# Patient Record
Sex: Female | Born: 1980
Health system: Southern US, Community
[De-identification: ages and names within clinical notes are randomized; demographics above are authoritative.]

## PROBLEM LIST (undated history)

## (undated) DIAGNOSIS — U071 COVID-19: Secondary | ICD-10-CM

## (undated) DIAGNOSIS — F32A Depression, unspecified: Secondary | ICD-10-CM

## (undated) DIAGNOSIS — J45909 Unspecified asthma, uncomplicated: Secondary | ICD-10-CM

## (undated) DIAGNOSIS — F419 Anxiety disorder, unspecified: Secondary | ICD-10-CM

## (undated) DIAGNOSIS — E282 Polycystic ovarian syndrome: Secondary | ICD-10-CM

## (undated) HISTORY — PX: KNEE SURGERY: SHX244

## (undated) HISTORY — PX: BACK SURGERY: SHX140

---

## 2017-09-18 ENCOUNTER — Encounter (HOSPITAL_COMMUNITY): Payer: Self-pay

## 2017-09-18 ENCOUNTER — Other Ambulatory Visit: Payer: Self-pay

## 2017-09-18 ENCOUNTER — Emergency Department (HOSPITAL_COMMUNITY)
Admission: EM | Admit: 2017-09-18 | Discharge: 2017-09-18 | Disposition: A | Discharge status: Home / Self Care | Payer: Medicaid Other | Attending: Emergency Medicine | Admitting: Emergency Medicine

## 2017-09-18 DIAGNOSIS — F17200 Nicotine dependence, unspecified, uncomplicated: Secondary | ICD-10-CM | POA: Insufficient documentation

## 2017-09-18 DIAGNOSIS — H9312 Tinnitus, left ear: Secondary | ICD-10-CM | POA: Insufficient documentation

## 2017-09-18 NOTE — ED Provider Notes (Signed)
[facility]-EMERGENCY DEPT Provider Note   CSN: [id number] Arrival date & time: 09/18/17  4098     History   Chief Complaint Chief Complaint  Patient presents with  . ringing of ear    HPI Valisha Heslin is a 37 y.o. female.  37 year old healthy female who presents with ringing in ear.  Yesterday morning around 5 AM, she began having ringing in her left ear that has been relatively constant since it began.  She denies any hearing loss.  No recent exposure to loud noise, headache, fevers, vertigo, focal weakness/numbness, balance problems, vomiting, URI symptoms, allergic rhinitis symptoms, or recent illness.  No new medications.  No alcohol or drug use.  The history is provided by the patient.    History reviewed. No pertinent past medical history.  There are no active problems to display for this patient.   Past Surgical History:  Procedure Laterality Date  . BACK SURGERY       OB History   None      Home Medications    Prior to Admission medications   Medication Sig Start Date End Date Taking? Authorizing Provider  HYDROcodone-acetaminophen (NORCO) 7.5-325 MG tablet Take 1 tablet by mouth every 6 (six) hours as needed for moderate pain.   Yes [provider]    Family History Family History  Problem Relation Age of Onset  . Diabetes Mother   . Heart failure Mother   . Hypertension Mother     Social History Social History   Tobacco Use  . Smoking status: Current Some Day Smoker  . Smokeless tobacco: Never Used  Substance Use Topics  . Alcohol use: Yes    Comment: occasinoally  . Drug use: Never     Allergies   Patient has no known allergies.   Review of Systems Review of Systems All other systems reviewed and are negative except that which was mentioned in HPI   Physical Exam Updated Vital Signs BP 109/80 (BP Location: Right Arm)   Pulse 87   Temp 98.4 F (36.9 C) (Oral)   Resp 20   Ht 5\' 8"  (1.727 m)    Wt 73 kg   LMP 09/10/2017   SpO2 100%   BMI 24.48 kg/m   Physical Exam  Constitutional: She is oriented to person, place, and time. She appears well-developed and well-nourished. No distress.  Awake, alert  HENT:  Head: Normocephalic and atraumatic.  Right Ear: Tympanic membrane and ear canal normal.  Left Ear: Tympanic membrane and ear canal normal.  Eyes: Pupils are equal, round, and reactive to light. Conjunctivae and EOM are normal.  Neck: Neck supple.  Cardiovascular: Normal rate, regular rhythm and normal heart sounds.  No murmur heard. Pulmonary/Chest: Effort normal and breath sounds normal. No respiratory distress.  Abdominal: Soft. Bowel sounds are normal. She exhibits no distension. There is no tenderness.  Musculoskeletal: She exhibits no edema.  Neurological: She is alert and oriented to person, place, and time. She has normal reflexes. She displays normal reflexes. No cranial nerve deficit. She exhibits normal muscle tone.  Fluent speech, normal finger-to-nose testing, no clonus 5/5 strength and normal sensation x all 4 extremities  Skin: Skin is warm and dry.  Psychiatric: She has a normal mood and affect. Judgment and thought content normal.  Nursing note and vitals reviewed.    ED Treatments / Results  Labs (all labs ordered are listed, but only abnormal results are displayed) Labs Reviewed - No data to display  EKG None  Radiology No results found.  Procedures Procedures (including critical care time)  Medications Ordered in ED Medications - No data to display   Initial Impression / Assessment and Plan / ED Course  I have reviewed the triage vital signs and the nursing notes.       Normal neuro exam, no headache, vertigo, or other neuro complaints to suggest vascular problem, no URI/allergy sx to suggest eustachian tube dysfunction.  No evidence of otitis media or other problem of TM.  Recommended follow-up with ENT for clinical evaluation and  possible audiology evaluation if her symptoms continue.  Return precautions reviewed and patient voiced understanding.  Final Clinical Impressions(s) / ED Diagnoses   Final diagnoses:  Tinnitus of left ear    ED Discharge Orders    None       Calie Buttrey, Ambrose Finlandachel Morgan, MD 09/18/17 [phone]

## 2017-09-18 NOTE — ED Notes (Signed)
Patient verbalized understanding of discharge instructions, no questions. Patient ambulated out of ED with steady gait in no distress.  

## 2017-09-18 NOTE — ED Triage Notes (Signed)
Patient c/o ringing of the left ear since yesterday.

## 2017-10-27 DIAGNOSIS — F17290 Nicotine dependence, other tobacco product, uncomplicated: Secondary | ICD-10-CM | POA: Insufficient documentation

## 2017-10-27 DIAGNOSIS — Z79899 Other long term (current) drug therapy: Secondary | ICD-10-CM | POA: Insufficient documentation

## 2017-10-27 DIAGNOSIS — R51 Headache: Secondary | ICD-10-CM | POA: Diagnosis present

## 2017-10-27 DIAGNOSIS — G43809 Other migraine, not intractable, without status migrainosus: Secondary | ICD-10-CM | POA: Diagnosis not present

## 2017-10-28 ENCOUNTER — Encounter (HOSPITAL_COMMUNITY): Payer: Self-pay | Admitting: Emergency Medicine

## 2017-10-28 ENCOUNTER — Emergency Department (HOSPITAL_COMMUNITY)
Admission: EM | Admit: 2017-10-28 | Discharge: 2017-10-28 | Disposition: A | Discharge status: Home / Self Care | Payer: Medicaid Other | Attending: Emergency Medicine | Admitting: Emergency Medicine

## 2017-10-28 ENCOUNTER — Emergency Department (HOSPITAL_COMMUNITY): Payer: Medicaid Other

## 2017-10-28 DIAGNOSIS — G43809 Other migraine, not intractable, without status migrainosus: Secondary | ICD-10-CM

## 2017-10-28 MED ORDER — DIPHENHYDRAMINE HCL 50 MG/ML IJ SOLN
25.0000 mg | Freq: Once | INTRAMUSCULAR | Status: AC
  Administered 2017-10-28: 25 mg via INTRAVENOUS
  Filled 2017-10-28: qty 1

## 2017-10-28 MED ORDER — DEXAMETHASONE SODIUM PHOSPHATE 10 MG/ML IJ SOLN
10.0000 mg | Freq: Once | INTRAMUSCULAR | Status: AC
  Administered 2017-10-28: 10 mg via INTRAVENOUS
  Filled 2017-10-28: qty 1

## 2017-10-28 MED ORDER — METOCLOPRAMIDE HCL 5 MG/ML IJ SOLN
10.0000 mg | Freq: Once | INTRAMUSCULAR | Status: AC
  Administered 2017-10-28: 10 mg via INTRAVENOUS
  Filled 2017-10-28: qty 2

## 2017-10-28 NOTE — ED Provider Notes (Signed)
[facility] EMERGENCY DEPARTMENT Provider Note   CSN: [id number] Arrival date & time: 10/27/17  2358     History   Chief Complaint Chief Complaint  Patient presents with  . Headache    HPI Angelisse Riso is a 37 y.o. female.  Patient is a 37 year old female who presents with a headache.  She describes a one-week history of pain to her forehead shooting to the top of her head.  She has no nausea or vomiting.  No fevers.  No recent head injury.  No dizziness.  No numbness or weakness to her extremities.  No fevers.  She has had similar headaches in the past although they typically resolve with ibuprofen.  She never had any that have lasted this long and she is never had any that she is had to come get treatment for.  She denies any associated neck pain.  She has tried ibuprofen with no improvement in symptoms.     History reviewed. No pertinent past medical history.  There are no active problems to display for this patient.   Past Surgical History:  Procedure Laterality Date  . BACK SURGERY       OB History   None      Home Medications    Prior to Admission medications   Medication Sig Start Date End Date Taking? Authorizing Provider  HYDROcodone-acetaminophen (NORCO) 7.5-325 MG tablet Take 1 tablet by mouth every 6 (six) hours as needed for moderate pain.   Yes [provider]    Family History Family History  Problem Relation Age of Onset  . Diabetes Mother   . Heart failure Mother   . Hypertension Mother     Social History Social History   Tobacco Use  . Smoking status: Current Some Day Smoker    Types: Cigars  . Smokeless tobacco: Never Used  Substance Use Topics  . Alcohol use: Yes    Comment: occasinoally  . Drug use: Never     Allergies   Patient has no known allergies.   Review of Systems Review of Systems  Constitutional: Negative for chills, diaphoresis, fatigue and fever.  HENT: Negative for  congestion, rhinorrhea and sneezing.   Eyes: Negative.   Respiratory: Negative for cough, chest tightness and shortness of breath.   Cardiovascular: Negative for chest pain and leg swelling.  Gastrointestinal: Negative for abdominal pain, blood in stool, diarrhea, nausea and vomiting.  Genitourinary: Negative for difficulty urinating, flank pain, frequency and hematuria.  Musculoskeletal: Negative for arthralgias and back pain.  Skin: Negative for rash.  Neurological: Positive for headaches. Negative for dizziness, speech difficulty, weakness and numbness.     Physical Exam Updated Vital Signs BP 112/71   Pulse 76   Temp 98.7 F (37.1 C) (Oral)   Resp 17   Ht 5\' 8"  (1.727 m)   Wt 73 kg   LMP 10/03/2017 (Approximate)   SpO2 100%   BMI 24.48 kg/m   Physical Exam  Constitutional: She is oriented to person, place, and time. She appears well-developed and well-nourished.  HENT:  Head: Normocephalic and atraumatic.  Eyes: Pupils are equal, round, and reactive to light.  Neck: Normal range of motion. Neck supple.  Cardiovascular: Normal rate, regular rhythm and normal heart sounds.  Pulmonary/Chest: Effort normal and breath sounds normal. No respiratory distress. She has no wheezes. She has no rales. She exhibits no tenderness.  Abdominal: Soft. Bowel sounds are normal. There is no tenderness. There is no rebound and no guarding.  Musculoskeletal: Normal range of motion. She exhibits no edema.  Lymphadenopathy:    She has no cervical adenopathy.  Neurological: She is alert and oriented to person, place, and time.  Motor 5/5 all extremities Sensation grossly intact to LT all extremities Finger to Nose intact, no pronator drift CN II-XII grossly intact    Skin: Skin is warm and dry. No rash noted.  Psychiatric: She has a normal mood and affect.     ED Treatments / Results  Labs (all labs ordered are listed, but only abnormal results are displayed) Labs Reviewed - No data  to display  EKG None  Radiology Ct Head Wo Contrast  Result Date: 10/28/2017 CLINICAL DATA:  37 year old female with headache. EXAM: CT HEAD WITHOUT CONTRAST TECHNIQUE: Contiguous axial images were obtained from the base of the skull through the vertex without intravenous contrast. COMPARISON:  None. FINDINGS: Brain: No evidence of acute infarction, hemorrhage, hydrocephalus, extra-axial collection or mass lesion/mass effect. Vascular: No hyperdense vessel or unexpected calcification. Skull: Normal. Negative for fracture or focal lesion. Sinuses/Orbits: No acute finding. Other: None IMPRESSION: Normal noncontrast CT of the brain. Electronically Signed   By: Elgie Collard M.D.   On: 10/28/2017 06:44    Procedures Procedures (including critical care time)  Medications Ordered in ED Medications  diphenhydrAMINE (BENADRYL) injection 25 mg (25 mg Intravenous Given 10/28/17 0554)  metoCLOPramide (REGLAN) injection 10 mg (10 mg Intravenous Given 10/28/17 0554)  dexamethasone (DECADRON) injection 10 mg (10 mg Intravenous Given 10/28/17 0554)     Initial Impression / Assessment and Plan / ED Course  I have reviewed the triage vital signs and the nursing notes.  Pertinent labs & imaging results that were available during my care of the patient were reviewed by me and considered in my medical decision making (see chart for details).     Patient is a 37 year old female who presents with headache.  She is had migraines before but none that have lasted this long.  She never been evaluated for migraines in the past.  She has no fever.  No other symptoms that would be more concerning for subarachnoid hemorrhage or meningitis.  She has no neurologic deficits.  She is feeling completely better after migraine cocktail in the ED.  She was discharged home in good condition.  She was given a referral to neurology if her symptoms continue.  Return precautions were given.  Final Clinical Impressions(s) /  ED Diagnoses   Final diagnoses:  Other migraine without status migrainosus, not intractable    ED Discharge Orders    None       Rolan Bucco, MD 10/28/17 [phone]

## 2017-10-28 NOTE — ED Notes (Signed)
Pt verbalizes understanding of d/c instructions. Pt ambulatory at d/c with all belongings and with family.   

## 2017-10-28 NOTE — ED Triage Notes (Signed)
Pt reports frontal headache all week with hx of same. States pain radiates to the back of her head. Sensitive to light and sound. Ibuprofen not effective.

## 2018-01-25 ENCOUNTER — Emergency Department (HOSPITAL_BASED_OUTPATIENT_CLINIC_OR_DEPARTMENT_OTHER): Payer: Managed Care, Other (non HMO)

## 2018-01-25 ENCOUNTER — Emergency Department (HOSPITAL_BASED_OUTPATIENT_CLINIC_OR_DEPARTMENT_OTHER)
Admission: EM | Admit: 2018-01-25 | Discharge: 2018-01-25 | Disposition: A | Discharge status: Home / Self Care | Payer: Managed Care, Other (non HMO) | Attending: Emergency Medicine | Admitting: Emergency Medicine

## 2018-01-25 ENCOUNTER — Encounter (HOSPITAL_BASED_OUTPATIENT_CLINIC_OR_DEPARTMENT_OTHER): Payer: Self-pay | Admitting: *Deleted

## 2018-01-25 ENCOUNTER — Other Ambulatory Visit: Payer: Self-pay

## 2018-01-25 DIAGNOSIS — Z3202 Encounter for pregnancy test, result negative: Secondary | ICD-10-CM | POA: Diagnosis not present

## 2018-01-25 DIAGNOSIS — F17290 Nicotine dependence, other tobacco product, uncomplicated: Secondary | ICD-10-CM | POA: Diagnosis not present

## 2018-01-25 DIAGNOSIS — R0789 Other chest pain: Secondary | ICD-10-CM | POA: Diagnosis not present

## 2018-01-25 DIAGNOSIS — R0602 Shortness of breath: Secondary | ICD-10-CM | POA: Diagnosis not present

## 2018-01-25 LAB — CBC
HCT: 37.7 % (ref 36.0–46.0)
Hemoglobin: 11.7 g/dL — ABNORMAL LOW (ref 12.0–15.0)
MCH: 28.8 pg (ref 26.0–34.0)
MCHC: 31 g/dL (ref 30.0–36.0)
MCV: 92.9 fL (ref 80.0–100.0)
Platelets: 399 10*3/uL (ref 150–400)
RBC: 4.06 MIL/uL (ref 3.87–5.11)
RDW: 14.4 % (ref 11.5–15.5)
WBC: 7.7 10*3/uL (ref 4.0–10.5)
nRBC: 0 % (ref 0.0–0.2)

## 2018-01-25 LAB — TROPONIN I

## 2018-01-25 LAB — BASIC METABOLIC PANEL
ANION GAP: 7 (ref 5–15)
BUN: 12 mg/dL (ref 6–20)
CALCIUM: 8.6 mg/dL — AB (ref 8.9–10.3)
CO2: 26 mmol/L (ref 22–32)
Chloride: 103 mmol/L (ref 98–111)
Creatinine, Ser: 0.83 mg/dL (ref 0.44–1.00)
Glucose, Bld: 86 mg/dL (ref 70–99)
Potassium: 3.5 mmol/L (ref 3.5–5.1)
SODIUM: 136 mmol/L (ref 135–145)

## 2018-01-25 LAB — D-DIMER, QUANTITATIVE (NOT AT ARMC): D DIMER QUANT: 0.3 ug{FEU}/mL (ref 0.00–0.50)

## 2018-01-25 LAB — PREGNANCY, URINE: Preg Test, Ur: NEGATIVE

## 2018-01-25 MED ORDER — METHOCARBAMOL 500 MG PO TABS
500.0000 mg | ORAL_TABLET | Freq: Once | ORAL | Status: AC
  Administered 2018-01-25: 500 mg via ORAL
  Filled 2018-01-25: qty 1

## 2018-01-25 MED ORDER — METHOCARBAMOL 500 MG PO TABS: 500.0000 mg | ORAL_TABLET | Freq: Two times a day (BID) | ORAL | 0 refills | Status: DC

## 2018-01-25 MED ORDER — MORPHINE SULFATE (PF) 4 MG/ML IV SOLN
4.0000 mg | Freq: Once | INTRAVENOUS | Status: AC
  Administered 2018-01-25: 4 mg via INTRAVENOUS
  Filled 2018-01-25: qty 1

## 2018-01-25 MED FILL — METHOCARBAMOL 500 MG TABLET: 500 | 10 days supply | Qty: 20 | Fill #0

## 2018-01-25 NOTE — ED Notes (Signed)
Patient transported to X-ray 

## 2018-01-25 NOTE — ED Notes (Signed)
ED Provider at bedside. 

## 2018-01-25 NOTE — ED Provider Notes (Signed)
[facility] EMERGENCY DEPARTMENT Provider Note   CSN: [id number] Arrival date & time: 01/25/18  1244     History   Chief Complaint Chief Complaint  Patient presents with  . Chest Pain    HPI Jamie Vang is a 38 y.o. female.  Jamie Vang is a 38 y.o. female who is otherwise healthy, presents to the emergency department for evaluation of sharp left-sided chest pain.  She reports pain initially started last Friday as a sharp pain underneath her left breast, this pain seems to come and go and is is worse with deep breath and is associated with some mild shortness of breath.  She initially went to urgent care last Friday they did a chest x-ray and EKG which were reassuring and sent her home with Voltaren which initially gave some relief but this morning when she woke up and turned over in bed pain seemed to return.  Pain does not radiate to the arm neck or jaw.  It is not made worse with exertion.  It is worse with deep breath as well as palpation and movement.  She denies any inciting injury or trauma, no recent strenuous activity or heavy lifting.  No cough or fever.  No lower extremity swelling or pain.  No history of PE or DVT, no recent long distance travel or surgery, no associated hemoptysis, patient is not on any estrogen containing birth control.  She denies any associated abdominal pain, no nausea, vomiting or diaphoresis.  She took Voltaren today without relief has not tried anything else to treat this pain, denies any other aggravating or alleviating factors.  No history of previous cardiac events.  No smoking history, no history of hypertension, diabetes, hyperlipidemia or obesity.  Denies alcohol or drug use.     History reviewed. No pertinent past medical history.  There are no active problems to display for this patient.   Past Surgical History:  Procedure Laterality Date  . BACK SURGERY    . CESAREAN SECTION    . KNEE SURGERY       OB History     No obstetric history on file.      Home Medications    Prior to Admission medications   Medication Sig Start Date End Date Taking? Authorizing Provider  HYDROcodone-acetaminophen (NORCO) 7.5-325 MG tablet Take 1 tablet by mouth every 6 (six) hours as needed for moderate pain.    [provider]  methocarbamol (ROBAXIN) 500 MG tablet Take 1 tablet (500 mg total) by mouth 2 (two) times daily. 01/25/18   Dartha LodgeFord, Starlena Beil N, PA-C    Family History Family History  Problem Relation Age of Onset  . Diabetes Mother   . Heart failure Mother   . Hypertension Mother     Social History Social History   Tobacco Use  . Smoking status: Current Some Day Smoker    Types: Cigars  . Smokeless tobacco: Never Used  Substance Use Topics  . Alcohol use: Yes    Comment: occasinoally  . Drug use: Never     Allergies   Patient has no known allergies.   Review of Systems Review of Systems  Constitutional: Negative for chills and fever.  HENT: Negative.   Eyes: Negative for visual disturbance.  Respiratory: Positive for shortness of breath. Negative for cough, chest tightness and wheezing.   Cardiovascular: Positive for chest pain. Negative for palpitations and leg swelling.  Gastrointestinal: Negative for abdominal pain, nausea and vomiting.  Genitourinary: Negative for dysuria, flank pain  and frequency.  Musculoskeletal: Negative for arthralgias and myalgias.  Skin: Negative for color change and rash.  Neurological: Negative for dizziness, syncope, light-headedness and headaches.     Physical Exam Updated Vital Signs BP 106/70 (BP Location: Left Arm)   Pulse 64   Temp 98.5 F (36.9 C) (Oral)   Resp 14   Ht 5\' 8"  (1.727 m)   Wt 77.1 kg   LMP 01/19/2018 (Exact Date)   SpO2 100%   BMI 25.85 kg/m   Physical Exam Vitals signs and nursing note reviewed.  Constitutional:      General: She is not in acute distress.    Appearance: She is well-developed and normal weight.  She is not ill-appearing or diaphoretic.  HENT:     Head: Normocephalic and atraumatic.  Eyes:     General:        Right eye: No discharge.        Left eye: No discharge.  Neck:     Musculoskeletal: Neck supple.     Trachea: No tracheal deviation.  Cardiovascular:     Rate and Rhythm: Normal rate and regular rhythm.     Pulses:          Radial pulses are 2+ on the right side and 2+ on the left side.       Dorsalis pedis pulses are 2+ on the right side and 2+ on the left side.       Posterior tibial pulses are 2+ on the right side and 2+ on the left side.     Heart sounds: Normal heart sounds. No murmur. No friction rub. No gallop.   Pulmonary:     Effort: Pulmonary effort is normal. No respiratory distress.     Breath sounds: Normal breath sounds.     Comments: Respirations equal and unlabored, patient able to speak in full sentences, lungs clear to auscultation bilaterally Chest:     Chest wall: Tenderness present.    Abdominal:     General: Bowel sounds are normal.     Palpations: Abdomen is soft. There is no mass.     Tenderness: There is no abdominal tenderness. There is no guarding or rebound.     Comments: Respirations equal and unlabored, patient able to speak in full sentences, lungs clear to auscultation bilaterally  Musculoskeletal:     Right lower leg: She exhibits no tenderness. No edema.     Left lower leg: She exhibits no tenderness. No edema.  Skin:    General: Skin is warm and dry.     Capillary Refill: Capillary refill takes less than 2 seconds.  Neurological:     Mental Status: She is alert and oriented to person, place, and time.     Coordination: Coordination normal.  Psychiatric:        Mood and Affect: Mood normal.        Behavior: Behavior normal.      ED Treatments / Results  Labs (all labs ordered are listed, but only abnormal results are displayed) Labs Reviewed  BASIC METABOLIC PANEL - Abnormal; Notable for the following components:       Result Value   Calcium 8.6 (*)    All other components within normal limits  CBC - Abnormal; Notable for the following components:   Hemoglobin 11.7 (*)    All other components within normal limits  D-DIMER, QUANTITATIVE (NOT AT [facility])  TROPONIN I  PREGNANCY, URINE    EKG EKG Interpretation  Date/Time:  Thursday  January 25 2018 12:52:35 EST Ventricular Rate:  73 PR Interval:    QRS Duration: 85 QT Interval:  394 QTC Calculation: 435 R Axis:   86 Text Interpretation:  Sinus rhythm Normal ECG No previous tracing Confirmed by Gwyneth Sprout ([id number]) on 01/25/2018 1:34:42 PM   Radiology Dg Chest 2 View  Result Date: 01/25/2018 CLINICAL DATA:  Chest pain EXAM: CHEST - 2 VIEW COMPARISON:  None. FINDINGS: Lungs are clear. Heart size and pulmonary vascularity are normal. No adenopathy. No pneumothorax. No bone lesions. IMPRESSION: No edema or consolidation. Electronically Signed   By: Bretta Bang III M.D.   On: 01/25/2018 13:21    Procedures Procedures (including critical care time)  Medications Ordered in ED Medications  morphine 4 MG/ML injection 4 mg (4 mg Intravenous Given 01/25/18 1426)  methocarbamol (ROBAXIN) tablet 500 mg (500 mg Oral Given 01/25/18 1426)     Initial Impression / Assessment and Plan / ED Course  I have reviewed the triage vital signs and the nursing notes.  Pertinent labs & imaging results that were available during my care of the patient were reviewed by me and considered in my medical decision making (see chart for details).  Patient is to be discharged with recommendation to follow up with PCP in regards to today's hospital visit.  Feel that chest pain is most likely musculoskeletal in nature and has been present intermittently over the past week and is worse with certain movements and can be reproduced with palpation.  Chest pain is not likely of cardiac or pulmonary etiology d/t presentation, given pleuritic nature of pain there was some concern  for PE although patient is otherwise low risk, d-dimer was negative, VSS, no tracheal deviation, no JVD or new murmur, RRR, breath sounds equal bilaterally, EKG without acute abnormalities, negative troponin, and negative CXR.  Chest pain resolved with treatment with muscle relaxer and morphine here in the emergency department.  Discharge home with Robaxin and have her continue with NSAID therapy as well.  Pt has been advised to return to the ED if CP becomes exertional, associated with diaphoresis or nausea, radiates to left jaw/arm, worsens or becomes concerning in any way. Pt appears reliable for follow up and is agreeable to discharge.    Final Clinical Impressions(s) / ED Diagnoses   Final diagnoses:  Atypical chest pain  Chest wall pain    ED Discharge Orders         Ordered    methocarbamol (ROBAXIN) 500 MG tablet  2 times daily     01/25/18 1550           Dartha Lodge, [place] 01/29/18 1353    Gwyneth Sprout, MD 01/31/18 1721

## 2018-01-25 NOTE — ED Triage Notes (Signed)
Last Friday she had cp under left breast that felt like a stabbing pain, associated with SOB, worsening with deep breath. Newton PiggVolteran was given and gave some relief for 2 days.

## 2018-12-25 ENCOUNTER — Ambulatory Visit: Payer: Managed Care, Other (non HMO) | Attending: Internal Medicine

## 2018-12-25 DIAGNOSIS — Z20822 Contact with and (suspected) exposure to covid-19: Secondary | ICD-10-CM

## 2018-12-27 LAB — NOVEL CORONAVIRUS, NAA: SARS-CoV-2, NAA: NOT DETECTED

## 2019-09-03 ENCOUNTER — Emergency Department (HOSPITAL_BASED_OUTPATIENT_CLINIC_OR_DEPARTMENT_OTHER)
Admission: EM | Admit: 2019-09-03 | Discharge: 2019-09-03 | Disposition: A | Discharge status: Home / Self Care | Payer: Managed Care, Other (non HMO) | Attending: Emergency Medicine | Admitting: Emergency Medicine

## 2019-09-03 ENCOUNTER — Encounter (HOSPITAL_BASED_OUTPATIENT_CLINIC_OR_DEPARTMENT_OTHER): Payer: Self-pay | Admitting: Emergency Medicine

## 2019-09-03 ENCOUNTER — Emergency Department (HOSPITAL_BASED_OUTPATIENT_CLINIC_OR_DEPARTMENT_OTHER): Payer: Managed Care, Other (non HMO)

## 2019-09-03 ENCOUNTER — Emergency Department (HOSPITAL_BASED_OUTPATIENT_CLINIC_OR_DEPARTMENT_OTHER)
Admit: 2019-09-03 | Discharge: 2019-09-03 | Disposition: A | Discharge status: Home / Self Care | Payer: Managed Care, Other (non HMO) | Attending: Emergency Medicine | Admitting: Emergency Medicine

## 2019-09-03 ENCOUNTER — Other Ambulatory Visit: Payer: Self-pay

## 2019-09-03 DIAGNOSIS — M25562 Pain in left knee: Secondary | ICD-10-CM | POA: Insufficient documentation

## 2019-09-03 DIAGNOSIS — F17290 Nicotine dependence, other tobacco product, uncomplicated: Secondary | ICD-10-CM | POA: Diagnosis not present

## 2019-09-03 MED ORDER — MECLIZINE HCL 25 MG PO TABS: 12.5000 mg | ORAL_TABLET | Freq: Once | ORAL | Status: DC

## 2019-09-03 MED ORDER — DEXAMETHASONE SODIUM PHOSPHATE 4 MG/ML IJ SOLN: 4.0000 mg | Freq: Once | INTRAMUSCULAR | Status: DC

## 2019-09-03 MED ORDER — DIPHENHYDRAMINE HCL 50 MG/ML IJ SOLN: 12.5000 mg | Freq: Once | INTRAMUSCULAR | Status: DC

## 2019-09-03 MED ORDER — NAPROXEN 250 MG PO TABS
500.0000 mg | ORAL_TABLET | Freq: Once | ORAL | Status: AC
  Administered 2019-09-03: 500 mg via ORAL
  Filled 2019-09-03: qty 2

## 2019-09-03 MED ORDER — IBUPROFEN 800 MG PO TABS: 800.0000 mg | ORAL_TABLET | Freq: Three times a day (TID) | ORAL | 0 refills | Status: DC

## 2019-09-03 MED ORDER — METOCLOPRAMIDE HCL 5 MG/ML IJ SOLN: 10.0000 mg | Freq: Once | INTRAMUSCULAR | Status: DC

## 2019-09-03 MED ORDER — ACETAMINOPHEN 500 MG PO TABS
1000.0000 mg | ORAL_TABLET | Freq: Once | ORAL | Status: AC
  Administered 2019-09-03: 1000 mg via ORAL
  Filled 2019-09-03: qty 2

## 2019-09-03 NOTE — ED Triage Notes (Signed)
Pt c/o pain to left knee x 2 days, no known injury

## 2019-09-03 NOTE — ED Provider Notes (Signed)
Patient informed of negative DVT study, given copy of study and work note.  IMPRESSION: No evidence of left lower extremity deep venous thrombosis.   Note: Portions of this report may have been transcribed using voice recognition software. Every effort was made to ensure accuracy; however, inadvertent computerized transcription errors may still be present.   Bill Salinas, PA-C 09/03/19 1157    Melene Plan, DO 09/03/19 1159

## 2019-09-03 NOTE — ED Provider Notes (Signed)
[facility] EMERGENCY DEPARTMENT Provider Note   CSN: [id number] Arrival date & time: 09/03/19  7169     History Chief Complaint  Patient presents with  . Knee Pain    Stephaniemarie Stoffel is a 39 y.o. female.  The history is provided by the patient.  Knee Pain Location:  Knee Time since incident:  2 days Injury: no   Knee location:  L knee (posterior to the knee with shooting pain down the leg ) Pain details:    Quality:  Shooting   Severity:  Moderate   Onset quality:  Gradual   Duration:  2 days   Timing:  Constant   Progression:  Unchanged Chronicity:  New Dislocation: no   Foreign body present:  No foreign bodies Tetanus status:  Unknown Relieved by:  Nothing Worsened by:  Nothing Ineffective treatments:  None tried Associated symptoms: no back pain, no decreased ROM, no fatigue, no fever, no itching, no muscle weakness, no neck pain, no numbness, no stiffness, no swelling and no tingling   Risk factors: no concern for non-accidental trauma        History reviewed. No pertinent past medical history.  There are no problems to display for this patient.   Past Surgical History:  Procedure Laterality Date  . BACK SURGERY    . CESAREAN SECTION    . KNEE SURGERY       OB History   No obstetric history on file.     Family History  Problem Relation Age of Onset  . Diabetes Mother   . Heart failure Mother   . Hypertension Mother     Social History   Tobacco Use  . Smoking status: Current Some Day Smoker    Types: Cigars  . Smokeless tobacco: Never Used  Vaping Use  . Vaping Use: Never used  Substance Use Topics  . Alcohol use: Yes    Comment: occasinoally  . Drug use: Never    Home Medications Prior to Admission medications   Medication Sig Start Date End Date Taking? Authorizing Provider  HYDROcodone-acetaminophen (NORCO) 7.5-325 MG tablet Take 1 tablet by mouth every 6 (six) hours as needed for moderate pain.    [provider]  ibuprofen (ADVIL) 800 MG tablet Take 1 tablet (800 mg total) by mouth 3 (three) times daily. 09/03/19   Braedon Sjogren, MD  methocarbamol (ROBAXIN) 500 MG tablet Take 1 tablet (500 mg total) by mouth 2 (two) times daily. 01/25/18   Dartha Lodge, PA-C    Allergies    Patient has no known allergies.  Review of Systems   Review of Systems  Constitutional: Negative for fatigue and fever.  HENT: Negative for congestion.   Eyes: Negative for visual disturbance.  Respiratory: Negative for shortness of breath.   Cardiovascular: Negative for chest pain and leg swelling.  Gastrointestinal: Negative for abdominal pain.  Genitourinary: Negative for difficulty urinating.  Musculoskeletal: Positive for arthralgias. Negative for back pain, neck pain and stiffness.  Skin: Negative for itching.  Neurological: Negative for dizziness.  Psychiatric/Behavioral: Negative for agitation.  All other systems reviewed and are negative.   Physical Exam Updated Vital Signs BP 105/73   Pulse 89   Temp 98.9 F (37.2 C) (Oral)   Resp 16   Ht 5\' 8"  (1.727 m)   Wt 94.8 kg   SpO2 98%   BMI 31.78 kg/m   Physical Exam Vitals and nursing note reviewed.  Constitutional:      General: She  is not in acute distress.    Appearance: Normal appearance.  HENT:     Head: Normocephalic and atraumatic.     Nose: Nose normal.  Eyes:     Conjunctiva/sclera: Conjunctivae normal.     Pupils: Pupils are equal, round, and reactive to light.  Cardiovascular:     Rate and Rhythm: Normal rate and regular rhythm.     Pulses: Normal pulses.     Heart sounds: Normal heart sounds.  Pulmonary:     Effort: Pulmonary effort is normal.     Breath sounds: Normal breath sounds.  Abdominal:     General: Abdomen is flat. Bowel sounds are normal.     Tenderness: There is no abdominal tenderness. There is no guarding or rebound.  Musculoskeletal:        General: No swelling or tenderness. Normal range of motion.       Cervical back: Normal range of motion and neck supple.     Left knee: Normal.     Right lower leg: Normal.     Left lower leg: Normal.     Right ankle: Normal.     Right Achilles Tendon: Normal.     Left ankle: Normal.     Left Achilles Tendon: Normal.     Right foot: Normal.     Left foot: Normal.     Comments: Negative Homan's sign   Skin:    General: Skin is warm and dry.     Capillary Refill: Capillary refill takes less than 2 seconds.  Neurological:     General: No focal deficit present.     Mental Status: She is alert and oriented to person, place, and time. Mental status is at baseline.     Deep Tendon Reflexes: Reflexes normal.  Psychiatric:        Mood and Affect: Mood normal.        Behavior: Behavior normal.     ED Results / Procedures / Treatments   Labs (all labs ordered are listed, but only abnormal results are displayed) Labs Reviewed - No data to display  EKG None  Radiology DG Knee Complete 4 Views Left  Result Date: 09/03/2019 CLINICAL DATA:  Left knee pain for 2 days, atraumatic EXAM: LEFT KNEE - COMPLETE 4+ VIEW COMPARISON:  None. FINDINGS: No evidence of fracture, dislocation, or joint effusion. No evidence of arthropathy or other focal bone abnormality. Soft tissues are unremarkable. IMPRESSION: Negative. Electronically Signed   By: Marnee Spring M.D.   On: 09/03/2019 04:51    Procedures Procedures (including critical care time)  Medications Ordered in ED Medications  naproxen (NAPROSYN) tablet 500 mg (has no administration in time range)  acetaminophen (TYLENOL) tablet 1,000 mg (has no administration in time range)    ED Course  I have reviewed the triage vital signs and the nursing notes.  Pertinent labs & imaging results that were available during my care of the patient were reviewed by me and considered in my medical decision making (see chart for details).    I suspect this is a Baker's cyst vs neuropathic pain but will send for  DVT study as an outpatient to ensure no clot causing this pain.  NSAIDs and ice.    Koren Plyler was evaluated in Emergency Department on 09/03/2019 for the symptoms described in the history of present illness. She was evaluated in the context of the global COVID-19 pandemic, which necessitated consideration that the patient might be at risk for infection with the SARS-CoV-2  virus that causes COVID-19. Institutional protocols and algorithms that pertain to the evaluation of patients at risk for COVID-19 are in a state of rapid change based on information released by regulatory bodies including the CDC and federal and state organizations. These policies and algorithms were followed during the patient's care in the ED.  Final Clinical Impression(s) / ED Diagnoses Final diagnoses:  Acute pain of left knee   Return for intractable cough, coughing up blood,fevers >100.4 unrelieved by medication, shortness of breath, intractable vomiting, chest pain, shortness of breath, weakness,numbness, changes in speech, facial asymmetry,abdominal pain, passing out,Inability to tolerate liquids or food, cough, altered mental status or any concerns. No signs of systemic illness or infection. The patient is nontoxic-appearing on exam and vital signs are within normal limits.   I have reviewed the triage vital signs and the nursing notes. Pertinent labs &imaging results that were available during my care of the patient were reviewed by me and considered in my medical decision making (see chart for details).After history, exam, and medical workup I feel the patient has beenappropriately medically screened and is safe for discharge home. Pertinent diagnoses were discussed with the patient. Patient was given return precautions. Rx / DC Orders ED Discharge Orders         Ordered    US Venous Img Lower Unilateral Left        09/03/19 0620    ibuprofen (ADVIL) 800 MG tablet  3 times daily        09/03/19 0633            Zorah Backes, MD 09/03/19 [phone]

## 2020-01-11 ENCOUNTER — Emergency Department (HOSPITAL_BASED_OUTPATIENT_CLINIC_OR_DEPARTMENT_OTHER)
Admission: EM | Admit: 2020-01-11 | Discharge: 2020-01-11 | Disposition: A | Discharge status: Home / Self Care | Payer: Managed Care, Other (non HMO) | Attending: Emergency Medicine | Admitting: Emergency Medicine

## 2020-01-11 ENCOUNTER — Encounter (HOSPITAL_BASED_OUTPATIENT_CLINIC_OR_DEPARTMENT_OTHER): Payer: Self-pay | Admitting: Emergency Medicine

## 2020-01-11 ENCOUNTER — Other Ambulatory Visit: Payer: Self-pay

## 2020-01-11 DIAGNOSIS — E860 Dehydration: Secondary | ICD-10-CM | POA: Insufficient documentation

## 2020-01-11 DIAGNOSIS — R112 Nausea with vomiting, unspecified: Secondary | ICD-10-CM | POA: Diagnosis present

## 2020-01-11 DIAGNOSIS — U071 COVID-19: Secondary | ICD-10-CM | POA: Diagnosis not present

## 2020-01-11 DIAGNOSIS — F17290 Nicotine dependence, other tobacco product, uncomplicated: Secondary | ICD-10-CM | POA: Diagnosis not present

## 2020-01-11 LAB — CBC
HCT: 42.6 % (ref 36.0–46.0)
Hemoglobin: 14.1 g/dL (ref 12.0–15.0)
MCH: 28.8 pg (ref 26.0–34.0)
MCHC: 33.1 g/dL (ref 30.0–36.0)
MCV: 86.9 fL (ref 80.0–100.0)
Platelets: 364 10*3/uL (ref 150–400)
RBC: 4.9 MIL/uL (ref 3.87–5.11)
RDW: 13.2 % (ref 11.5–15.5)
WBC: 6 10*3/uL (ref 4.0–10.5)
nRBC: 0 % (ref 0.0–0.2)

## 2020-01-11 LAB — COMPREHENSIVE METABOLIC PANEL
ALT: 31 U/L (ref 0–44)
AST: 34 U/L (ref 15–41)
Albumin: 4.4 g/dL (ref 3.5–5.0)
Alkaline Phosphatase: 43 U/L (ref 38–126)
Anion gap: 12 (ref 5–15)
BUN: 8 mg/dL (ref 6–20)
CO2: 25 mmol/L (ref 22–32)
Calcium: 9 mg/dL (ref 8.9–10.3)
Chloride: 96 mmol/L — ABNORMAL LOW (ref 98–111)
Creatinine, Ser: 0.9 mg/dL (ref 0.44–1.00)
GFR, Estimated: >60 mL/min (ref >60)
Glucose, Bld: 97 mg/dL (ref 70–99)
Potassium: 3.4 mmol/L — ABNORMAL LOW (ref 3.5–5.1)
Sodium: 133 mmol/L — ABNORMAL LOW (ref 135–145)
Total Bilirubin: 0.4 mg/dL (ref 0.3–1.2)
Total Protein: 8.3 g/dL — ABNORMAL HIGH (ref 6.5–8.1)

## 2020-01-11 MED ORDER — ONDANSETRON HCL 4 MG/2ML IJ SOLN
4.0000 mg | Freq: Once | INTRAMUSCULAR | Status: AC
  Administered 2020-01-11: 4 mg via INTRAVENOUS
  Filled 2020-01-11: qty 2

## 2020-01-11 MED ORDER — ONDANSETRON 4 MG PO TBDP: 4.0000 mg | ORAL_TABLET | Freq: Three times a day (TID) | ORAL | 0 refills | Status: DC | PRN

## 2020-01-11 MED ORDER — SODIUM CHLORIDE 0.9 % IV BOLUS
1000.0000 mL | Freq: Once | INTRAVENOUS | Status: AC
  Administered 2020-01-11: 1000 mL via INTRAVENOUS

## 2020-01-11 NOTE — ED Triage Notes (Signed)
Pt was diagnosed with covid on Thursday  Pt states she has not been able to eat or drink for the past three days because everything comes back up   Pt denies diarrhea

## 2020-01-11 NOTE — Discharge Instructions (Addendum)
You were evaluated in the Emergency Department and after careful evaluation, we did not find any emergent condition requiring admission or further testing in the hospital.  Your exam/testing today was overall reassuring.  Symptoms seem to be due to COVID-19 infection.  Please use the Zofran nausea medicine to help you drink fluids at home and stay hydrated.  Please return to the Emergency Department if you experience any worsening of your condition.  Thank you for allowing [place] to be a part of your care.

## 2020-01-11 NOTE — ED Provider Notes (Signed)
[facility]-EMERGENCY DEPT [facility] Emergency Department Provider Note MRN:  [id number]  Arrival date & time: 01/11/20     Chief Complaint   Emesis   History of Present Illness   Jamie Vang is a 40 y.o. year-old female with no pertinent past medical history presenting to the ED with chief complaint of emesis.  Patient has had mild Covid symptoms for 6 days.  Malaise, fatigue, nausea.  Nausea and vomiting is worsened over the past 3 days and she has not been able to eat or drink anything during this time.  She feels dehydrated.  She denies fever or cough, no headache or vision change, no chest pain or shortness of breath, no abdominal pain, no dysuria.  Review of Systems  A complete 10 system review of systems was obtained and all systems are negative except as noted in the HPI and PMH.   Patient's Health History   History reviewed. No pertinent past medical history.  Past Surgical History:  Procedure Laterality Date  . CESAREAN SECTION    . KNEE SURGERY      Family History  Problem Relation Age of Onset  . Diabetes Mother   . Heart failure Mother   . Hypertension Mother     Social History   Socioeconomic History  . Marital status: Single    Spouse name: Not on file  . Number of children: Not on file  . Years of education: Not on file  . Highest education level: Not on file  Occupational History  . Not on file  Tobacco Use  . Smoking status: Current Some Day Smoker    Types: Cigars  . Smokeless tobacco: Never Used  Vaping Use  . Vaping Use: Never used  Substance and Sexual Activity  . Alcohol use: Yes    Comment: occasinoally  . Drug use: Never  . Sexual activity: Not on file  Other Topics Concern  . Not on file  Social History Narrative  . Not on file   Social Determinants of Health   Financial Resource Strain: Not on file  Food Insecurity: Not on file  Transportation Needs: Not on file  Physical Activity: Not on file  Stress: Not on file   Social Connections: Not on file  Intimate Partner Violence: Not on file     Physical Exam   Vitals:   01/11/20 0338  BP: 135/83  Pulse: 99  Resp: 20  Temp: 98.1 F (36.7 C)  SpO2: 100%    CONSTITUTIONAL: Well-appearing, NAD NEURO:  Alert and oriented x 3, no focal deficits EYES:  eyes equal and reactive ENT/NECK:  no LAD, no JVD CARDIO: Regular rate, well-perfused, normal S1 and S2 PULM:  CTAB no wheezing or rhonchi GI/GU:  normal bowel sounds, non-distended, non-tender MSK/SPINE:  No gross deformities, no edema SKIN:  no rash, atraumatic PSYCH:  Appropriate speech and behavior  *Additional and/or pertinent findings included in MDM below  Diagnostic and Interventional Summary    EKG Interpretation  Date/Time:    Ventricular Rate:    PR Interval:    QRS Duration:   QT Interval:    QTC Calculation:   R Axis:     Text Interpretation:        Labs Reviewed  COMPREHENSIVE METABOLIC PANEL - Abnormal; Notable for the following components:      Result Value   Sodium 133 (*)    Potassium 3.4 (*)    Chloride 96 (*)    Total Protein 8.3 (*)  All other components within normal limits  CBC    No orders to display    Medications  sodium chloride 0.9 % bolus 1,000 mL (1,000 mLs Intravenous New Bag/Given 01/11/20 0412)  ondansetron (ZOFRAN) injection 4 mg (4 mg Intravenous Given 01/11/20 0408)     Procedures  /  Critical Care Procedures  ED Course and Medical Decision Making  I have reviewed the triage vital signs, the nursing notes, and pertinent available records from the EMR.  Listed above are laboratory and imaging tests that I personally ordered, reviewed, and interpreted and then considered in my medical decision making (see below for details).  Suspect nausea and vomiting in the setting COVID-19, benign abdomen, normal neurological exam, overall reassuring vital signs.  Will provide liter of fluid, check labs to ensure no significant metabolic disturbance or  AKI.  Anticipating discharge on antiemetics.       Elmer Sow. Pilar Plate, MD [facility] Emergency Medicine [facility] [email or web address]  Final Clinical Impressions(s) / ED Diagnoses     ICD-10-CM   1. COVID-19  U07.1   2. Non-intractable vomiting with nausea, unspecified vomiting type  R11.2   3. Dehydration  E86.0     ED Discharge Orders         Ordered    ondansetron (ZOFRAN ODT) 4 MG disintegrating tablet  Every 8 hours PRN        01/11/20 0503           Discharge Instructions Discussed with and Provided to Patient:     Discharge Instructions     You were evaluated in the Emergency Department and after careful evaluation, we did not find any emergent condition requiring admission or further testing in the hospital.  Your exam/testing today was overall reassuring.  Symptoms seem to be due to COVID-19 infection.  Please use the Zofran nausea medicine to help you drink fluids at home and stay hydrated.  Please return to the Emergency Department if you experience any worsening of your condition.  Thank you for allowing [place] to be a part of your care.        Sabas Sous, MD 01/11/20 [phone]

## 2020-02-01 ENCOUNTER — Emergency Department (HOSPITAL_BASED_OUTPATIENT_CLINIC_OR_DEPARTMENT_OTHER)
Admission: EM | Admit: 2020-02-01 | Discharge: 2020-02-01 | Disposition: A | Discharge status: Home / Self Care | Payer: Managed Care, Other (non HMO) | Attending: Emergency Medicine | Admitting: Emergency Medicine

## 2020-02-01 ENCOUNTER — Other Ambulatory Visit: Payer: Self-pay

## 2020-02-01 ENCOUNTER — Encounter (HOSPITAL_BASED_OUTPATIENT_CLINIC_OR_DEPARTMENT_OTHER): Payer: Self-pay | Admitting: Emergency Medicine

## 2020-02-01 DIAGNOSIS — E876 Hypokalemia: Secondary | ICD-10-CM | POA: Diagnosis not present

## 2020-02-01 DIAGNOSIS — Z8616 Personal history of COVID-19: Secondary | ICD-10-CM | POA: Insufficient documentation

## 2020-02-01 DIAGNOSIS — F17290 Nicotine dependence, other tobacco product, uncomplicated: Secondary | ICD-10-CM | POA: Insufficient documentation

## 2020-02-01 DIAGNOSIS — R112 Nausea with vomiting, unspecified: Secondary | ICD-10-CM | POA: Diagnosis not present

## 2020-02-01 HISTORY — DX: COVID-19: U07.1

## 2020-02-01 HISTORY — DX: Polycystic ovarian syndrome: E28.2

## 2020-02-01 LAB — COMPREHENSIVE METABOLIC PANEL
ALT: 10 U/L (ref 0–44)
AST: 12 U/L — ABNORMAL LOW (ref 15–41)
Albumin: 3.8 g/dL (ref 3.5–5.0)
Alkaline Phosphatase: 33 U/L — ABNORMAL LOW (ref 38–126)
Anion gap: 10 (ref 5–15)
BUN: 5 mg/dL — ABNORMAL LOW (ref 6–20)
CO2: 24 mmol/L (ref 22–32)
Calcium: 8.6 mg/dL — ABNORMAL LOW (ref 8.9–10.3)
Chloride: 99 mmol/L (ref 98–111)
Creatinine, Ser: 0.73 mg/dL (ref 0.44–1.00)
GFR, Estimated: >60 mL/min (ref >60)
Glucose, Bld: 110 mg/dL — ABNORMAL HIGH (ref 70–99)
Potassium: 3.2 mmol/L — ABNORMAL LOW (ref 3.5–5.1)
Sodium: 133 mmol/L — ABNORMAL LOW (ref 135–145)
Total Bilirubin: 0.1 mg/dL — ABNORMAL LOW (ref 0.3–1.2)
Total Protein: 7.2 g/dL (ref 6.5–8.1)

## 2020-02-01 LAB — CBC WITH DIFFERENTIAL/PLATELET
Abs Immature Granulocytes: 0.03 10*3/uL (ref 0.00–0.07)
Basophils Absolute: 0 10*3/uL (ref 0.0–0.1)
Basophils Relative: 0 %
Eosinophils Absolute: 0.1 10*3/uL (ref 0.0–0.5)
Eosinophils Relative: 1 %
HCT: 33.5 % — ABNORMAL LOW (ref 36.0–46.0)
Hemoglobin: 11.1 g/dL — ABNORMAL LOW (ref 12.0–15.0)
Immature Granulocytes: 0 %
Lymphocytes Relative: 27 %
Lymphs Abs: 2.7 10*3/uL (ref 0.7–4.0)
MCH: 29.1 pg (ref 26.0–34.0)
MCHC: 33.1 g/dL (ref 30.0–36.0)
MCV: 87.9 fL (ref 80.0–100.0)
Monocytes Absolute: 0.5 10*3/uL (ref 0.1–1.0)
Monocytes Relative: 6 %
Neutro Abs: 6.5 10*3/uL (ref 1.7–7.7)
Neutrophils Relative %: 66 %
Platelets: 381 10*3/uL (ref 150–400)
RBC: 3.81 MIL/uL — ABNORMAL LOW (ref 3.87–5.11)
RDW: 13.1 % (ref 11.5–15.5)
WBC: 9.8 10*3/uL (ref 4.0–10.5)
nRBC: 0 % (ref 0.0–0.2)

## 2020-02-01 LAB — MAGNESIUM: Magnesium: 1.8 mg/dL (ref 1.7–2.4)

## 2020-02-01 MED ORDER — SODIUM CHLORIDE 0.9 % IV BOLUS
1000.0000 mL | Freq: Once | INTRAVENOUS | Status: AC
  Administered 2020-02-01: 1000 mL via INTRAVENOUS

## 2020-02-01 MED ORDER — POTASSIUM CHLORIDE CRYS ER 20 MEQ PO TBCR
40.0000 meq | EXTENDED_RELEASE_TABLET | Freq: Once | ORAL | Status: AC
  Administered 2020-02-01: 40 meq via ORAL
  Filled 2020-02-01: qty 2

## 2020-02-01 MED ORDER — METOCLOPRAMIDE HCL 10 MG PO TABS: 10.0000 mg | ORAL_TABLET | Freq: Three times a day (TID) | ORAL | 0 refills | Status: DC | PRN

## 2020-02-01 MED ORDER — METOCLOPRAMIDE HCL 5 MG/ML IJ SOLN
5.0000 mg | Freq: Once | INTRAMUSCULAR | Status: AC
  Administered 2020-02-01: 5 mg via INTRAVENOUS
  Filled 2020-02-01: qty 2

## 2020-02-01 NOTE — Discharge Instructions (Signed)
Follow-up with your primary care provider.  Take Reglan as needed as prescribed for nausea and vomiting.

## 2020-02-01 NOTE — ED Provider Notes (Signed)
[facility] EMERGENCY DEPARTMENT Provider Note   CSN: [id number] Arrival date & time: 02/01/20  2142     History Chief Complaint  Patient presents with  . Emesis    Jamie Vang is a 40 y.o. female.  40 year old female presents with complaint of nausea and vomiting for 3 weeks since being diagnosed with COVID. Denies abdominal pain, fevers, cough, changes in bowel or bladder habits. Patient has tried Zofran without relief, states she vomits almost immediately after eating or drinking anything. No other complaints. History of PCOS.  Jamie Vang was evaluated in Emergency Department on 02/01/2020 for the symptoms described in the history of present illness. She was evaluated in the context of the global COVID-19 pandemic, which necessitated consideration that the patient might be at risk for infection with the SARS-CoV-2 virus that causes COVID-19. Institutional protocols and algorithms that pertain to the evaluation of patients at risk for COVID-19 are in a state of rapid change based on information released by regulatory bodies including the CDC and federal and state organizations. These policies and algorithms were followed during the patient's care in the ED.         Past Medical History:  Diagnosis Date  . COVID-19   . PCOS (polycystic ovarian syndrome)     There are no problems to display for this patient.   Past Surgical History:  Procedure Laterality Date  . CESAREAN SECTION    . KNEE SURGERY       OB History   No obstetric history on file.     Family History  Problem Relation Age of Onset  . Diabetes Mother   . Heart failure Mother   . Hypertension Mother     Social History   Tobacco Use  . Smoking status: Current Some Day Smoker    Types: Cigars  . Smokeless tobacco: Never Used  Vaping Use  . Vaping Use: Never used  Substance Use Topics  . Alcohol use: Yes    Comment: occasinoally  . Drug use: Never    Home  Medications Prior to Admission medications   Medication Sig Start Date End Date Taking? Authorizing Provider  metoCLOPramide (REGLAN) 10 MG tablet Take 1 tablet (10 mg total) by mouth every 8 (eight) hours as needed for nausea. 02/01/20  Yes Jeannie Fend, PA-C  HYDROcodone-acetaminophen (NORCO) 7.5-325 MG tablet Take 1 tablet by mouth every 6 (six) hours as needed for moderate pain.    [provider]  ibuprofen (ADVIL) 800 MG tablet Take 1 tablet (800 mg total) by mouth 3 (three) times daily. 09/03/19   Palumbo, April, MD  methocarbamol (ROBAXIN) 500 MG tablet Take 1 tablet (500 mg total) by mouth 2 (two) times daily. 01/25/18   Dartha Lodge, PA-C  ondansetron (ZOFRAN ODT) 4 MG disintegrating tablet Take 1 tablet (4 mg total) by mouth every 8 (eight) hours as needed for nausea or vomiting. 01/11/20   Sabas Sous, MD    Allergies    Patient has no known allergies.  Review of Systems   Review of Systems  Constitutional: Negative for fever.  Respiratory: Negative for cough.   Cardiovascular: Negative for chest pain.  Gastrointestinal: Positive for nausea and vomiting. Negative for abdominal pain, constipation and diarrhea.  Genitourinary: Negative for decreased urine volume, dysuria and frequency.  Musculoskeletal: Negative for arthralgias and myalgias.  Skin: Negative for rash and wound.  Allergic/Immunologic: Negative for immunocompromised state.  Neurological: Negative for weakness.  Psychiatric/Behavioral: Negative for confusion.  All other systems reviewed and are negative.   Physical Exam Updated Vital Signs BP 110/64   Pulse 86   Temp 98.6 F (37 C) (Oral)   Resp 16   LMP  (LMP Unknown)   SpO2 100%   Physical Exam Vitals and nursing note reviewed.  Constitutional:      General: She is not in acute distress.    Appearance: She is well-developed and well-nourished. She is not diaphoretic.  HENT:     Head: Normocephalic and atraumatic.  Eyes:      Conjunctiva/sclera: Conjunctivae normal.  Cardiovascular:     Rate and Rhythm: Normal rate and regular rhythm.     Pulses: Normal pulses.     Heart sounds: Normal heart sounds.  Pulmonary:     Effort: Pulmonary effort is normal.     Breath sounds: Normal breath sounds.  Abdominal:     Palpations: Abdomen is soft.     Tenderness: There is no abdominal tenderness. There is no right CVA tenderness or left CVA tenderness.  Musculoskeletal:     Right lower leg: No edema.     Left lower leg: No edema.  Skin:    General: Skin is warm and dry.     Findings: No erythema or rash.  Neurological:     Mental Status: She is alert and oriented to person, place, and time.  Psychiatric:        Mood and Affect: Mood and affect normal.        Behavior: Behavior normal.     ED Results / Procedures / Treatments   Labs (all labs ordered are listed, but only abnormal results are displayed) Labs Reviewed  COMPREHENSIVE METABOLIC PANEL - Abnormal; Notable for the following components:      Result Value   Sodium 133 (*)    Potassium 3.2 (*)    Glucose, Bld 110 (*)    BUN 5 (*)    Calcium 8.6 (*)    AST 12 (*)    Alkaline Phosphatase 33 (*)    Total Bilirubin 0.1 (*)    All other components within normal limits  CBC WITH DIFFERENTIAL/PLATELET - Abnormal; Notable for the following components:   RBC 3.81 (*)    Hemoglobin 11.1 (*)    HCT 33.5 (*)    All other components within normal limits  MAGNESIUM    EKG None  Radiology No results found.  Procedures Procedures   Medications Ordered in ED Medications  metoCLOPramide (REGLAN) injection 5 mg (5 mg Intravenous Given 02/01/20 2219)  sodium chloride 0.9 % bolus 1,000 mL (0 mLs Intravenous Stopped 02/01/20 2300)  potassium chloride SA (KLOR-CON) CR tablet 40 mEq (40 mEq Oral Given 02/01/20 2259)    ED Course  I have reviewed the triage vital signs and the nursing notes.  Pertinent labs & imaging results that were available during my  care of the patient were reviewed by me and considered in my medical decision making (see chart for details).  Clinical Course as of 02/01/20 2310  Sat Feb 01, 2020  8931 40 year old female with complaint of nausea and vomiting x3 weeks since diagnosis with Covid.  Patient states that she does not have abdominal pain or associated symptoms.  On exam, abdomen soft and nontender, lungs are clear to auscultation.  Patient was given Reglan and IV fluids, and feeling much better.  Was found to have mild hypokalemia with potassium 3.2, good control potassium and is tolerating fluids by mouth.  CBC without  significant findings, magnesium within normal limits.  Patient is discharged with prescription for Reglan and advised up with her PCP. In regards to potential pregnancy causing her symptoms, patient states that she could not possibly/physically be pregnant, refuses pregnancy test today. [LM]    Clinical Course User Index [LM] Alden Hipp   MDM Rules/Calculators/A&P                          Final Clinical Impression(s) / ED Diagnoses Final diagnoses:  Non-intractable vomiting with nausea, unspecified vomiting type  Hypokalemia    Rx / DC Orders ED Discharge Orders         Ordered    metoCLOPramide (REGLAN) 10 MG tablet  Every 8 hours PRN        02/01/20 2308           Jeannie Fend, PA-C 02/01/20 2310    Rolan Bucco, MD 02/02/20 1600

## 2020-02-01 NOTE — ED Notes (Signed)
Water provided for po challenge 

## 2020-02-01 NOTE — ED Triage Notes (Addendum)
Reports nausea and vomiting for three weeks since contracting covid 1/6. States no pain or diarrhea. Taking zofran without relief. LMP unknown - reports maybe last month - Hx PCOS. Prompted to provide urine sample.

## 2020-02-08 ENCOUNTER — Other Ambulatory Visit: Payer: Self-pay

## 2020-02-08 ENCOUNTER — Encounter (HOSPITAL_BASED_OUTPATIENT_CLINIC_OR_DEPARTMENT_OTHER): Payer: Self-pay | Admitting: Emergency Medicine

## 2020-02-08 ENCOUNTER — Emergency Department (HOSPITAL_BASED_OUTPATIENT_CLINIC_OR_DEPARTMENT_OTHER)
Admission: EM | Admit: 2020-02-08 | Discharge: 2020-02-08 | Disposition: A | Discharge status: Home / Self Care | Payer: Managed Care, Other (non HMO) | Attending: Emergency Medicine | Admitting: Emergency Medicine

## 2020-02-08 DIAGNOSIS — O2311 Infections of bladder in pregnancy, first trimester: Secondary | ICD-10-CM | POA: Insufficient documentation

## 2020-02-08 DIAGNOSIS — Z87891 Personal history of nicotine dependence: Secondary | ICD-10-CM | POA: Insufficient documentation

## 2020-02-08 DIAGNOSIS — N3000 Acute cystitis without hematuria: Secondary | ICD-10-CM | POA: Insufficient documentation

## 2020-02-08 DIAGNOSIS — O219 Vomiting of pregnancy, unspecified: Secondary | ICD-10-CM | POA: Insufficient documentation

## 2020-02-08 DIAGNOSIS — Z8616 Personal history of COVID-19: Secondary | ICD-10-CM | POA: Diagnosis not present

## 2020-02-08 DIAGNOSIS — Z3A00 Weeks of gestation of pregnancy not specified: Secondary | ICD-10-CM | POA: Diagnosis not present

## 2020-02-08 LAB — CBC
HCT: 36.7 % (ref 36.0–46.0)
Hemoglobin: 12.2 g/dL (ref 12.0–15.0)
MCH: 29 pg (ref 26.0–34.0)
MCHC: 33.2 g/dL (ref 30.0–36.0)
MCV: 87.4 fL (ref 80.0–100.0)
Platelets: 401 10*3/uL — ABNORMAL HIGH (ref 150–400)
RBC: 4.2 MIL/uL (ref 3.87–5.11)
RDW: 13.4 % (ref 11.5–15.5)
WBC: 9.1 10*3/uL (ref 4.0–10.5)
nRBC: 0 % (ref 0.0–0.2)

## 2020-02-08 LAB — URINALYSIS, ROUTINE W REFLEX MICROSCOPIC
Bilirubin Urine: NEGATIVE
Glucose, UA: NEGATIVE mg/dL
Ketones, ur: 40 mg/dL — AB
Nitrite: POSITIVE — AB
Protein, ur: 100 mg/dL — AB
Specific Gravity, Urine: 1.03 (ref 1.005–1.030)
pH: 6 (ref 5.0–8.0)

## 2020-02-08 LAB — URINALYSIS, MICROSCOPIC (REFLEX)
RBC / HPF: >50 RBC/hpf (ref 0–5)
WBC, UA: >50 WBC/hpf (ref 0–5)

## 2020-02-08 LAB — COMPREHENSIVE METABOLIC PANEL
ALT: 25 U/L (ref 0–44)
AST: 23 U/L (ref 15–41)
Albumin: 4.1 g/dL (ref 3.5–5.0)
Alkaline Phosphatase: 38 U/L (ref 38–126)
Anion gap: 12 (ref 5–15)
BUN: 7 mg/dL (ref 6–20)
CO2: 24 mmol/L (ref 22–32)
Calcium: 9 mg/dL (ref 8.9–10.3)
Chloride: 97 mmol/L — ABNORMAL LOW (ref 98–111)
Creatinine, Ser: 0.76 mg/dL (ref 0.44–1.00)
GFR, Estimated: >60 mL/min (ref >60)
Glucose, Bld: 99 mg/dL (ref 70–99)
Potassium: 3.2 mmol/L — ABNORMAL LOW (ref 3.5–5.1)
Sodium: 133 mmol/L — ABNORMAL LOW (ref 135–145)
Total Bilirubin: 0.3 mg/dL (ref 0.3–1.2)
Total Protein: 7.9 g/dL (ref 6.5–8.1)

## 2020-02-08 LAB — HCG, QUANTITATIVE, PREGNANCY: hCG, Beta Chain, Quant, S: 88497 m[IU]/mL — ABNORMAL HIGH (ref <5)

## 2020-02-08 LAB — PREGNANCY, URINE: Preg Test, Ur: POSITIVE — AB

## 2020-02-08 LAB — LIPASE, BLOOD: Lipase: 26 U/L (ref 11–51)

## 2020-02-08 MED ORDER — METOCLOPRAMIDE HCL 5 MG/ML IJ SOLN
10.0000 mg | Freq: Once | INTRAMUSCULAR | Status: AC
  Administered 2020-02-08: 10 mg via INTRAVENOUS
  Filled 2020-02-08: qty 2

## 2020-02-08 MED ORDER — SODIUM CHLORIDE 0.9 % IV BOLUS
1000.0000 mL | Freq: Once | INTRAVENOUS | Status: AC
  Administered 2020-02-08: 1000 mL via INTRAVENOUS

## 2020-02-08 NOTE — ED Notes (Signed)
Lab notified of add on urine culture

## 2020-02-08 NOTE — ED Provider Notes (Signed)
[facility] EMERGENCY DEPARTMENT Provider Note   CSN: [id number] Arrival date & time: 02/08/20  1734     History Chief Complaint  Patient presents with  . Abdominal Pain    Jamie Vang is a 40 y.o. female.  Patient presents emergency department for evaluation of nausea and vomiting. She reports recent positive pregnancy test. She states that she has been having difficulty eating and drinking for about the past month. She states that she has had several visits in the emergency department, most recently yesterday in [place], [place]. She was told she had a UTI -- rx Keflex and phenergan at that visit, but will be picking these up until tomorrow morning. She did not receive any IV fluids at that time. She states that she has tried every antinausea medication without improvement. She currently has a scopolamine patch in place. She has not had any focal abdominal tenderness. No fevers, URI symptoms, chest pain. She denies constipation or diarrhea.  The onset of this condition was acute. The course is constant. Aggravating factors: none. Alleviating factors: none.          Past Medical History:  Diagnosis Date  . COVID-19   . PCOS (polycystic ovarian syndrome)     There are no problems to display for this patient.   Past Surgical History:  Procedure Laterality Date  . CESAREAN SECTION    . KNEE SURGERY       OB History    Gravida  1   Para      Term      Preterm      AB      Living        SAB      IAB      Ectopic      Multiple      Live Births              Family History  Problem Relation Age of Onset  . Diabetes Mother   . Heart failure Mother   . Hypertension Mother     Social History   Tobacco Use  . Smoking status: Former Smoker    Types: Cigars  . Smokeless tobacco: Never Used  Vaping Use  . Vaping Use: Never used  Substance Use Topics  . Alcohol use: Yes    Comment: occasinoally  . Drug use: Never    Home  Medications Prior to Admission medications   Medication Sig Start Date End Date Taking? Authorizing Provider  HYDROcodone-acetaminophen (NORCO) 7.5-325 MG tablet Take 1 tablet by mouth every 6 (six) hours as needed for moderate pain.    [provider]  ibuprofen (ADVIL) 800 MG tablet Take 1 tablet (800 mg total) by mouth 3 (three) times daily. 09/03/19   Palumbo, April, MD  methocarbamol (ROBAXIN) 500 MG tablet Take 1 tablet (500 mg total) by mouth 2 (two) times daily. 01/25/18   Dartha Lodge, PA-C  metoCLOPramide (REGLAN) 10 MG tablet Take 1 tablet (10 mg total) by mouth every 8 (eight) hours as needed for nausea. 02/01/20   Jeannie Fend, PA-C  ondansetron (ZOFRAN ODT) 4 MG disintegrating tablet Take 1 tablet (4 mg total) by mouth every 8 (eight) hours as needed for nausea or vomiting. 01/11/20   Sabas Sous, MD    Allergies    Patient has no known allergies.  Review of Systems   Review of Systems  Constitutional: Negative for fever.  HENT: Negative for rhinorrhea and sore throat.  Eyes: Negative for redness.  Respiratory: Negative for cough.   Cardiovascular: Negative for chest pain.  Gastrointestinal: Positive for nausea and vomiting. Negative for abdominal pain and diarrhea.  Genitourinary: Negative for dysuria, frequency, hematuria and urgency.  Musculoskeletal: Negative for myalgias.  Skin: Negative for rash.  Neurological: Negative for headaches.    Physical Exam Updated Vital Signs BP 114/65 (BP Location: Right Arm)   Pulse 67   Temp 98.5 F (36.9 C) (Oral)   Resp 16   Ht 5\' 8"  (1.727 m)   Wt 88.5 kg   LMP 12/06/2019   SpO2 100%   BMI 29.65 kg/m   Physical Exam Vitals and nursing note reviewed.  Constitutional:      General: She is not in acute distress.    Appearance: She is well-developed.  HENT:     Head: Normocephalic and atraumatic.     Right Ear: External ear normal.     Left Ear: External ear normal.     Nose: Nose normal.  Eyes:      Conjunctiva/sclera: Conjunctivae normal.  Cardiovascular:     Rate and Rhythm: Normal rate and regular rhythm.     Heart sounds: No murmur heard.   Pulmonary:     Effort: No respiratory distress.     Breath sounds: No wheezing, rhonchi or rales.  Abdominal:     Palpations: Abdomen is soft.     Tenderness: There is generalized abdominal tenderness (Mild). There is no guarding or rebound.  Musculoskeletal:     Cervical back: Normal range of motion and neck supple.     Right lower leg: No edema.     Left lower leg: No edema.  Skin:    General: Skin is warm and dry.     Findings: No rash.  Neurological:     General: No focal deficit present.     Mental Status: She is alert. Mental status is at baseline.     Motor: No weakness.  Psychiatric:        Mood and Affect: Mood normal.     ED Results / Procedures / Treatments   Labs (all labs ordered are listed, but only abnormal results are displayed) Labs Reviewed  PREGNANCY, URINE - Abnormal; Notable for the following components:      Result Value   Preg Test, Ur POSITIVE (*)    All other components within normal limits  URINALYSIS, ROUTINE W REFLEX MICROSCOPIC - Abnormal; Notable for the following components:   Color, Urine AMBER (*)    APPearance CLOUDY (*)    Hgb urine dipstick LARGE (*)    Ketones, ur 40 (*)    Protein, ur 100 (*)    Nitrite POSITIVE (*)    Leukocytes,Ua SMALL (*)    All other components within normal limits  COMPREHENSIVE METABOLIC PANEL - Abnormal; Notable for the following components:   Sodium 133 (*)    Potassium 3.2 (*)    Chloride 97 (*)    All other components within normal limits  CBC - Abnormal; Notable for the following components:   Platelets 401 (*)    All other components within normal limits  HCG, QUANTITATIVE, PREGNANCY - Abnormal; Notable for the following components:   hCG, Beta Chain, Quant, S 88,497 (*)    All other components within normal limits  URINALYSIS, MICROSCOPIC (REFLEX)  - Abnormal; Notable for the following components:   Bacteria, UA MANY (*)    All other components within normal limits  URINE CULTURE  LIPASE,  BLOOD    EKG None  Radiology No results found.  Procedures Procedures   Medications Ordered in ED Medications  metoCLOPramide (REGLAN) injection 10 mg (10 mg Intravenous Given 02/08/20 2108)  sodium chloride 0.9 % bolus 1,000 mL (0 mLs Intravenous Stopped 02/08/20 2140)    ED Course  I have reviewed the triage vital signs and the nursing notes.  Pertinent labs & imaging results that were available during my care of the patient were reviewed by me and considered in my medical decision making (see chart for details).  Patient seen and examined. Work-up initiated. Medications ordered.   Vital signs reviewed and are as follows: BP 117/73 (BP Location: Left Arm)   Pulse 80   Temp 98.5 F (36.9 C) (Oral)   Resp 18   Ht 5\' 8"  (1.727 m)   Wt 88.5 kg   LMP 12/06/2019   SpO2 100%   BMI 29.65 kg/m   Patient rechecked and is feeling better after hydration.  Nausea improved with Reglan.  She is tolerating p.o.'s without vomiting.  She has appropriate medications prescribed at her ED visit yesterday.  This includes Phenergan for nausea and Keflex for UTI.  Culture today is pending.  Encouraged OB/GYN follow-up.   The patient was urged to return to the Emergency Department immediately with worsening of current symptoms, worsening abdominal pain, persistent vomiting, blood noted in stools, fever, or any other concerns. The patient verbalized understanding.      MDM Rules/Calculators/A&P                          Patient with vomiting in setting of early pregnancy, nonfocal abdominal pain that is mild. Vitals are stable, no fever. Labs reassuring, UA with signs of infection, culture pending. Imaging not felt indicated tonight, low concern for ectopic pregnancy given lack of focal pain.  Symptoms most consistent with hyperemesis in pregnancy.   Mild dehydration suspected, treated with IV fluids, now tolerating orals after treatment. Lungs are clear and no signs suggestive of PNA. Low concern for appendicitis, cholecystitis, pancreatitis, ruptured viscus, pyelonephritis, kidney stone, aortic dissection, aortic aneurysm or other emergent abdominal etiology.  Doubt ectopic pregnancy or threatened pregnancy.  Patient is denying any vaginal bleeding or discharge, lower abdominal pain.  Supportive therapy indicated with return if symptoms worsen.     Final Clinical Impression(s) / ED Diagnoses Final diagnoses:  Excessive vomiting in pregnancy  Acute cystitis without hematuria    Rx / DC Orders ED Discharge Orders    None       14/03/2019, PA-C 02/08/20 2333    2334, MD 02/08/20 [phone]

## 2020-02-08 NOTE — ED Notes (Signed)
Pt able to drink water without vomiting. States she is ready for discharge.

## 2020-02-08 NOTE — Discharge Instructions (Signed)
Please read and follow all provided instructions.  Your diagnoses today include:  1. Excessive vomiting in pregnancy   2. Acute cystitis without hematuria     Tests performed today include: Blood cell counts (white, red, and platelets) Electrolytes - slightly low sodium and potassium Kidney function test Urine test to check for infection - shows signs of infection Urine culture - pending results  Vital signs. See below for your results today.   Medications prescribed:  For nausea you may try half of a 25mg  doxylamine (Unisom) tablet twice a day and 25mg  of Vitamin B6, also called pyroxidine for nausea and vomiting in pregnancy. These medications are both inexpensive when bought over-the-counter.   Take any prescribed medications only as directed.  Home care instructions:  Follow any educational materials contained in this packet.  Please fill the nausea medication and antibiotic prescribed to you at your visit yesterday.  Follow-up instructions: Please follow-up with your primary care provider in the next 3 days for further evaluation of your symptoms.   Return instructions:   Please return to the Emergency Department if you experience worsening symptoms.   Return with vaginal bleeding or discharge, severe abdominal pain or back pain, persistent vomiting, fever.  Please return if you have any other emergent concerns.  Additional Information:  Your vital signs today were: BP 117/73 (BP Location: Left Arm)    Pulse 80    Temp 98.5 F (36.9 C) (Oral)    Resp 18    Ht 5\' 8"  (1.727 m)    Wt 88.5 kg    LMP 12/06/2019    SpO2 100%    BMI 29.65 kg/m  If your blood pressure (BP) was elevated above 135/85 this visit, please have this repeated by your doctor within one month. --------------

## 2020-02-08 NOTE — ED Triage Notes (Signed)
Pt arrives pov with c/o N/V and lower abdominal pain x 1 month. Recent positive blood test.

## 2020-02-08 NOTE — ED Notes (Addendum)
PIV infiltrated during IVF infusion; pt tolerated well; primary RN notified

## 2020-02-10 LAB — URINE CULTURE

## 2020-03-25 ENCOUNTER — Other Ambulatory Visit: Payer: Self-pay | Admitting: Obstetrics and Gynecology

## 2020-03-25 ENCOUNTER — Other Ambulatory Visit: Payer: Self-pay | Admitting: Obstetrics & Gynecology

## 2020-04-15 NOTE — H&P (Addendum)
Jamie Vang is a 40 y.o. female P: 2-0-1-2 who presents for sterilization because of her desire to cease childbearing.  She has only used abstinence in the past for contraception.  Her menstrual flow is regular each month for 3 days with pad change only twice a day.  She denies any cramping. She goes on to deny any pelvic pain, dyspareunia, changes in bowel or bladder function or vaginitis symptoms.  She wishes to proceed with sterilization.  Past Medical History  OB History: G: 3;  P: 2-0-1-2; C-section:  2003 and 2011  GYN History: menarche: 40 YO;  LMP : 03/13/2020;   Contraception: abstinence; History of HPV but no history of abnormal PAP smear.   Last PAP smear: 2020 normal with positive HPV  Medical History: PCOS,  COVID-19 (01/2020); Lumbar DDD; Lumbosacral Spondylosis with Radiculopathy, Vitamin D Deficiency and Depression  Surgical History: Left Knee Surgery-2009 Denies problems with anesthesia or history of blood transfusions  Family History: Diabetes Mellitus, Lung Cancer, Rheumatoid Arthritis and Hypertension  Social History: Single and employed with [employer];  Denies tobacco use and occasionally uses alcohol   Medications: Baclofen 10 mg   tid  prn Diclofenac 75 mg  bid Hydrocodone/Acetaminophen 10/325 mg q 6 hours prn Vitamin D (over the counter) daily  No Known Allergies  ROS: Admits to glasses but denies headache, vision changes, nasal congestion, dysphagia, tinnitus, dizziness, hoarseness, cough,  chest pain, shortness of breath, nausea, vomiting, diarrhea,constipation,  urinary frequency, urgency  dysuria, hematuria, vaginitis symptoms, pelvic pain, swelling of joints,easy bruising,  myalgias, arthralgias, skin rashes, unexplained weight loss and except as is mentioned in the history of present illness, patient's review of systems is otherwise negative.   Denies sensitivity to peanuts, shellfish, soy, latex or adhesives.   Physical Exam  Bp: 92/62;  P: 70 bpm;   R: 16;  Temperature: 98.5 degrees F orally; Weight: 190 lbs.; Height: 5\' 8" ;  BMI: 28.9;  O2Sat.: (room air)  98%  Neck: supple without masses or thyromegaly Lungs: clear to auscultation Heart: regular rate and rhythm Abdomen: soft, non-tender and no organomegaly Pelvic:EGBUS- wnl; vagina-normal rugae; uterus-normal size, cervix without lesions or motion tenderness; adnexae-no tenderness or masses Extremities:  no clubbing, cyanosis or edema   Assesment: Desire for Female Sterilization   Disposition:  A discussion was held with patient regarding the indication for her procedure(s) along with the risks, which include but are not limited to: reaction to anesthesia, damage to adjacent organs, infection and excessive bleeding.  The patient verbalized understanding of these risks and has consented to proceed with Laparoscopic Bilateral Salpingectomy at [facility] on April 22. 2022.   CSN# [id number]   Jamie J. [id number], PA-C  for Dr. Lowell Guitar. [place] Mode  Risks benefits alternatives discussed including but not limited to bleeding infection and injury.  Pt denied having any questions and consent signed and witnessed.

## 2020-04-22 ENCOUNTER — Other Ambulatory Visit (HOSPITAL_COMMUNITY)
Admission: RE | Admit: 2020-04-22 | Discharge: 2020-04-22 | Disposition: A | Discharge status: Home / Self Care | Payer: Managed Care, Other (non HMO) | Source: Ambulatory Visit | Attending: Obstetrics and Gynecology | Admitting: Obstetrics and Gynecology

## 2020-04-22 DIAGNOSIS — Z20822 Contact with and (suspected) exposure to covid-19: Secondary | ICD-10-CM | POA: Diagnosis not present

## 2020-04-22 DIAGNOSIS — Z01812 Encounter for preprocedural laboratory examination: Secondary | ICD-10-CM | POA: Insufficient documentation

## 2020-04-22 LAB — SARS CORONAVIRUS 2 (TAT 6-24 HRS): SARS Coronavirus 2: NEGATIVE

## 2020-04-23 ENCOUNTER — Encounter (HOSPITAL_COMMUNITY): Payer: Self-pay | Admitting: Obstetrics and Gynecology

## 2020-04-23 NOTE — Progress Notes (Signed)
Spoke with pt for pre-op call. Pt denies cardiac history, HTN or Diabetes.  Covid test done 4/0/22 and it's negative. Pt states she's been in quarantine since the test was done and understands that she stays in quarantine until she comes to the hospital tomorrow.

## 2020-04-24 ENCOUNTER — Ambulatory Visit (HOSPITAL_COMMUNITY): Payer: Managed Care, Other (non HMO) | Admitting: Anesthesiology

## 2020-04-24 ENCOUNTER — Other Ambulatory Visit: Payer: Self-pay

## 2020-04-24 ENCOUNTER — Ambulatory Visit (HOSPITAL_COMMUNITY)
Admission: RE | Admit: 2020-04-24 | Discharge: 2020-04-24 | Disposition: A | Discharge status: Home / Self Care | Payer: Managed Care, Other (non HMO) | Attending: Obstetrics and Gynecology | Admitting: Obstetrics and Gynecology

## 2020-04-24 ENCOUNTER — Encounter (HOSPITAL_COMMUNITY)
Admission: RE | Disposition: A | Discharge status: Home / Self Care | Payer: Self-pay | Source: Home / Self Care | Attending: Obstetrics and Gynecology

## 2020-04-24 ENCOUNTER — Encounter (HOSPITAL_COMMUNITY): Payer: Self-pay | Admitting: Obstetrics and Gynecology

## 2020-04-24 DIAGNOSIS — Z87891 Personal history of nicotine dependence: Secondary | ICD-10-CM | POA: Insufficient documentation

## 2020-04-24 DIAGNOSIS — Z8616 Personal history of COVID-19: Secondary | ICD-10-CM | POA: Insufficient documentation

## 2020-04-24 DIAGNOSIS — Z302 Encounter for sterilization: Secondary | ICD-10-CM | POA: Insufficient documentation

## 2020-04-24 HISTORY — DX: Unspecified asthma, uncomplicated: J45.909

## 2020-04-24 HISTORY — PX: LAPAROSCOPIC BILATERAL SALPINGECTOMY: SHX5889

## 2020-04-24 HISTORY — DX: Anxiety disorder, unspecified: F41.9

## 2020-04-24 HISTORY — DX: Depression, unspecified: F32.A

## 2020-04-24 LAB — BASIC METABOLIC PANEL
Anion gap: 9 (ref 5–15)
BUN: 6 mg/dL (ref 6–20)
CO2: 24 mmol/L (ref 22–32)
Calcium: 8.6 mg/dL — ABNORMAL LOW (ref 8.9–10.3)
Chloride: 105 mmol/L (ref 98–111)
Creatinine, Ser: 0.88 mg/dL (ref 0.44–1.00)
GFR, Estimated: >60 mL/min (ref >60)
Glucose, Bld: 85 mg/dL (ref 70–99)
Potassium: 5.8 mmol/L — ABNORMAL HIGH (ref 3.5–5.1)
Sodium: 138 mmol/L (ref 135–145)

## 2020-04-24 LAB — CBC
HCT: 41 % (ref 36.0–46.0)
Hemoglobin: 13.1 g/dL (ref 12.0–15.0)
MCH: 29.6 pg (ref 26.0–34.0)
MCHC: 32 g/dL (ref 30.0–36.0)
MCV: 92.6 fL (ref 80.0–100.0)
Platelets: 395 10*3/uL (ref 150–400)
RBC: 4.43 MIL/uL (ref 3.87–5.11)
RDW: 14.8 % (ref 11.5–15.5)
WBC: 6.2 10*3/uL (ref 4.0–10.5)
nRBC: 0 % (ref 0.0–0.2)

## 2020-04-24 LAB — POCT I-STAT, CHEM 8
BUN: 4 mg/dL — ABNORMAL LOW (ref 6–20)
Calcium, Ion: 1.15 mmol/L (ref 1.15–1.40)
Chloride: 102 mmol/L (ref 98–111)
Creatinine, Ser: 0.7 mg/dL (ref 0.44–1.00)
Glucose, Bld: 79 mg/dL (ref 70–99)
HCT: 36 % (ref 36.0–46.0)
Hemoglobin: 12.2 g/dL (ref 12.0–15.0)
Potassium: 4 mmol/L (ref 3.5–5.1)
Sodium: 139 mmol/L (ref 135–145)
TCO2: 25 mmol/L (ref 22–32)

## 2020-04-24 LAB — POCT PREGNANCY, URINE: Preg Test, Ur: NEGATIVE

## 2020-04-24 SURGERY — SALPINGECTOMY, BILATERAL, LAPAROSCOPIC
Anesthesia: General | Laterality: Bilateral

## 2020-04-24 MED ORDER — ACETAMINOPHEN 10 MG/ML IV SOLN
INTRAVENOUS | Status: AC
  Filled 2020-04-24: qty 100

## 2020-04-24 MED ORDER — CEFAZOLIN SODIUM-DEXTROSE 2-4 GM/100ML-% IV SOLN
INTRAVENOUS | Status: AC
  Filled 2020-04-24: qty 100

## 2020-04-24 MED ORDER — HYDROMORPHONE HCL 1 MG/ML IJ SOLN
INTRAMUSCULAR | Status: AC
  Filled 2020-04-24: qty 1

## 2020-04-24 MED ORDER — DEXAMETHASONE SODIUM PHOSPHATE 10 MG/ML IJ SOLN
INTRAMUSCULAR | Status: DC | PRN
  Administered 2020-04-24: 5 mg via INTRAVENOUS

## 2020-04-24 MED ORDER — HYDROMORPHONE HCL 1 MG/ML IJ SOLN
0.2500 mg | INTRAMUSCULAR | Status: DC | PRN
  Administered 2020-04-24: 0.5 mg via INTRAVENOUS

## 2020-04-24 MED ORDER — PROPOFOL 10 MG/ML IV BOLUS
INTRAVENOUS | Status: DC | PRN
  Administered 2020-04-24: 130 mg via INTRAVENOUS
  Administered 2020-04-24 (×2): 20 mg via INTRAVENOUS

## 2020-04-24 MED ORDER — PHENYLEPHRINE HCL-NACL 10-0.9 MG/250ML-% IV SOLN
INTRAVENOUS | Status: DC | PRN
  Administered 2020-04-24: 35 ug/min via INTRAVENOUS

## 2020-04-24 MED ORDER — CEFAZOLIN SODIUM-DEXTROSE 2-4 GM/100ML-% IV SOLN
2.0000 g | INTRAVENOUS | Status: AC
  Administered 2020-04-24: 2 g via INTRAVENOUS

## 2020-04-24 MED ORDER — ONDANSETRON HCL 4 MG/2ML IJ SOLN
INTRAMUSCULAR | Status: AC
  Filled 2020-04-24: qty 2

## 2020-04-24 MED ORDER — FENTANYL CITRATE (PF) 250 MCG/5ML IJ SOLN
INTRAMUSCULAR | Status: AC
  Filled 2020-04-24: qty 5

## 2020-04-24 MED ORDER — ACETAMINOPHEN 10 MG/ML IV SOLN
INTRAVENOUS | Status: DC | PRN
  Administered 2020-04-24: 1000 mg via INTRAVENOUS

## 2020-04-24 MED ORDER — PROPOFOL 10 MG/ML IV BOLUS
INTRAVENOUS | Status: AC
  Filled 2020-04-24: qty 40

## 2020-04-24 MED ORDER — LIDOCAINE 2% (20 MG/ML) 5 ML SYRINGE
INTRAMUSCULAR | Status: AC
  Filled 2020-04-24: qty 5

## 2020-04-24 MED ORDER — KETOROLAC TROMETHAMINE 30 MG/ML IJ SOLN
INTRAMUSCULAR | Status: AC
  Filled 2020-04-24: qty 1

## 2020-04-24 MED ORDER — OXYCODONE-ACETAMINOPHEN 5-325 MG PO TABS: ORAL_TABLET | ORAL | 0 refills | Status: DC

## 2020-04-24 MED ORDER — POVIDONE-IODINE 10 % EX SWAB
2.0000 "application " | Freq: Once | CUTANEOUS | Status: AC
  Administered 2020-04-24: 2 via TOPICAL

## 2020-04-24 MED ORDER — CEFAZOLIN SODIUM 1 G IJ SOLR
INTRAMUSCULAR | Status: AC
  Filled 2020-04-24: qty 20

## 2020-04-24 MED ORDER — DEXAMETHASONE SODIUM PHOSPHATE 10 MG/ML IJ SOLN
INTRAMUSCULAR | Status: AC
  Filled 2020-04-24: qty 1

## 2020-04-24 MED ORDER — LACTATED RINGERS IV SOLN: INTRAVENOUS | Status: DC

## 2020-04-24 MED ORDER — SODIUM CHLORIDE 0.9 % IR SOLN
Status: DC | PRN
  Administered 2020-04-24: 1000 mL

## 2020-04-24 MED ORDER — FENTANYL CITRATE (PF) 250 MCG/5ML IJ SOLN
INTRAMUSCULAR | Status: DC | PRN
  Administered 2020-04-24: 100 ug via INTRAVENOUS
  Administered 2020-04-24: 50 ug via INTRAVENOUS

## 2020-04-24 MED ORDER — ORAL CARE MOUTH RINSE: 15.0000 mL | Freq: Once | OROMUCOSAL | Status: DC

## 2020-04-24 MED ORDER — SUGAMMADEX SODIUM 200 MG/2ML IV SOLN
INTRAVENOUS | Status: DC | PRN
  Administered 2020-04-24: 200 mg via INTRAVENOUS

## 2020-04-24 MED ORDER — BUPIVACAINE HCL (PF) 0.25 % IJ SOLN
INTRAMUSCULAR | Status: AC
  Filled 2020-04-24: qty 10

## 2020-04-24 MED ORDER — CHLORHEXIDINE GLUCONATE 0.12 % MT SOLN: 15.0000 mL | Freq: Once | OROMUCOSAL | Status: DC

## 2020-04-24 MED ORDER — MIDAZOLAM HCL 2 MG/2ML IJ SOLN
INTRAMUSCULAR | Status: AC
  Filled 2020-04-24: qty 2

## 2020-04-24 MED ORDER — IBUPROFEN 600 MG PO TABS: ORAL_TABLET | ORAL | 1 refills | Status: DC

## 2020-04-24 MED ORDER — CHLORHEXIDINE GLUCONATE 0.12 % MT SOLN
OROMUCOSAL | Status: AC
  Administered 2020-04-24: 15 mL via OROMUCOSAL
  Filled 2020-04-24: qty 15

## 2020-04-24 MED ORDER — KETOROLAC TROMETHAMINE 30 MG/ML IJ SOLN
INTRAMUSCULAR | Status: DC | PRN
  Administered 2020-04-24: 30 mg via INTRAVENOUS

## 2020-04-24 MED ORDER — ORAL CARE MOUTH RINSE
15.0000 mL | Freq: Once | OROMUCOSAL | Status: AC
Start: 2020-04-24 — End: 2020-04-24

## 2020-04-24 MED ORDER — BUPIVACAINE HCL (PF) 0.25 % IJ SOLN
INTRAMUSCULAR | Status: DC | PRN
  Administered 2020-04-24: 2 mL
  Administered 2020-04-24: 7 mL

## 2020-04-24 MED ORDER — MIDAZOLAM HCL 2 MG/2ML IJ SOLN
INTRAMUSCULAR | Status: DC | PRN
  Administered 2020-04-24: 2 mg via INTRAVENOUS

## 2020-04-24 MED ORDER — PHENYLEPHRINE 40 MCG/ML (10ML) SYRINGE FOR IV PUSH (FOR BLOOD PRESSURE SUPPORT)
PREFILLED_SYRINGE | INTRAVENOUS | Status: DC | PRN
  Administered 2020-04-24: 80 ug via INTRAVENOUS
  Administered 2020-04-24: 120 ug via INTRAVENOUS

## 2020-04-24 MED ORDER — LIDOCAINE 2% (20 MG/ML) 5 ML SYRINGE
INTRAMUSCULAR | Status: DC | PRN
  Administered 2020-04-24: 60 mg via INTRAVENOUS

## 2020-04-24 MED ORDER — ONDANSETRON HCL 4 MG/2ML IJ SOLN
INTRAMUSCULAR | Status: DC | PRN
  Administered 2020-04-24: 4 mg via INTRAVENOUS

## 2020-04-24 MED ORDER — ROCURONIUM BROMIDE 100 MG/10ML IV SOLN
INTRAVENOUS | Status: DC | PRN
  Administered 2020-04-24: 50 mg via INTRAVENOUS

## 2020-04-24 MED ORDER — CHLORHEXIDINE GLUCONATE 0.12 % MT SOLN: 15.0000 mL | Freq: Once | OROMUCOSAL | Status: AC

## 2020-04-24 SURGICAL SUPPLY — 34 items
CABLE HIGH FREQUENCY MONO STRZ (ELECTRODE) IMPLANT
DERMABOND ADHESIVE PROPEN (GAUZE/BANDAGES/DRESSINGS) ×1
DERMABOND ADVANCED (GAUZE/BANDAGES/DRESSINGS) ×1
DERMABOND ADVANCED .7 DNX12 (GAUZE/BANDAGES/DRESSINGS) ×1 IMPLANT
DERMABOND ADVANCED .7 DNX6 (GAUZE/BANDAGES/DRESSINGS) ×1 IMPLANT
DRSG OPSITE POSTOP 3X4 (GAUZE/BANDAGES/DRESSINGS) ×4 IMPLANT
DURAPREP 26ML APPLICATOR (WOUND CARE) ×2 IMPLANT
GLOVE SURG ENC MOIS LTX SZ7.5 (GLOVE) ×2 IMPLANT
GLOVE SURG UNDER LTX SZ7.5 (GLOVE) ×4 IMPLANT
GLOVE SURG UNDER POLY LF SZ7 (GLOVE) ×4 IMPLANT
GOWN STRL REUS W/ TWL LRG LVL3 (GOWN DISPOSABLE) ×2 IMPLANT
GOWN STRL REUS W/TWL LRG LVL3 (GOWN DISPOSABLE) ×2
KIT TURNOVER KIT B (KITS) ×2 IMPLANT
LIGASURE VESSEL 5MM BLUNT TIP (ELECTROSURGICAL) ×2 IMPLANT
NEEDLE INSUFFLATION 14GA 120MM (NEEDLE) ×2 IMPLANT
NS IRRIG 1000ML POUR BTL (IV SOLUTION) ×2 IMPLANT
PACK LAPAROSCOPY BASIN (CUSTOM PROCEDURE TRAY) ×2 IMPLANT
PACK TRENDGUARD 450 HYBRID PRO (MISCELLANEOUS) IMPLANT
POUCH SPECIMEN RETRIEVAL 10MM (ENDOMECHANICALS) IMPLANT
PROTECTOR NERVE ULNAR (MISCELLANEOUS) ×4 IMPLANT
SCISSORS LAP 5X35 DISP (ENDOMECHANICALS) IMPLANT
SET IRRIG TUBING LAPAROSCOPIC (IRRIGATION / IRRIGATOR) IMPLANT
SET TUBE SMOKE EVAC HIGH FLOW (TUBING) ×2 IMPLANT
SLEEVE ENDOPATH XCEL 5M (ENDOMECHANICALS) ×2 IMPLANT
SUT MNCRL AB 3-0 PS2 27 (SUTURE) ×2 IMPLANT
SUT VICRYL 0 ENDOLOOP (SUTURE) IMPLANT
SUT VICRYL 0 UR6 27IN ABS (SUTURE) ×2 IMPLANT
SYR 50ML LL SCALE MARK (SYRINGE) IMPLANT
TOWEL GREEN STERILE FF (TOWEL DISPOSABLE) ×4 IMPLANT
TRAY FOLEY W/BAG SLVR 14FR (SET/KITS/TRAYS/PACK) ×2 IMPLANT
TRENDGUARD 450 HYBRID PRO PACK (MISCELLANEOUS)
TROCAR XCEL NON-BLD 11X100MML (ENDOMECHANICALS) ×2 IMPLANT
TROCAR XCEL NON-BLD 5MMX100MML (ENDOMECHANICALS) ×2 IMPLANT
WARMER LAPAROSCOPE (MISCELLANEOUS) ×2 IMPLANT

## 2020-04-24 NOTE — Anesthesia Preprocedure Evaluation (Signed)
Anesthesia Evaluation  Patient identified by MRN, date of birth, ID band Patient awake    Reviewed: Allergy & Precautions, H&P , NPO status , Patient's Chart, lab work & pertinent test results  Airway Mallampati: II  TM Distance: >3 FB Neck ROM: Full    Dental no notable dental hx. (+) Teeth Intact, Dental Advisory Given   Pulmonary asthma , former smoker,    Pulmonary exam normal breath sounds clear to auscultation       Cardiovascular negative cardio ROS   Rhythm:Regular Rate:Normal     Neuro/Psych Anxiety Depression negative neurological ROS     GI/Hepatic negative GI ROS, Neg liver ROS,   Endo/Other  negative endocrine ROS  Renal/GU negative Renal ROS  negative genitourinary   Musculoskeletal   Abdominal   Peds  Hematology negative hematology ROS (+)   Anesthesia Other Findings   Reproductive/Obstetrics negative OB ROS                             Anesthesia Physical Anesthesia Plan  ASA: II  Anesthesia Plan: General   Post-op Pain Management:    Induction: Intravenous  PONV Risk Score and Plan: 4 or greater and Ondansetron, Dexamethasone and Midazolam  Airway Management Planned: Oral ETT  Additional Equipment:   Intra-op Plan:   Post-operative Plan: Extubation in OR  Informed Consent: I have reviewed the patients History and Physical, chart, labs and discussed the procedure including the risks, benefits and alternatives for the proposed anesthesia with the patient or authorized representative who has indicated his/her understanding and acceptance.     Dental advisory given  Plan Discussed with: CRNA  Anesthesia Plan Comments:         Anesthesia Quick Evaluation

## 2020-04-24 NOTE — Anesthesia Postprocedure Evaluation (Signed)
Anesthesia Post Note  Patient: Jamie Vang  Procedure(s) Performed: LAPAROSCOPIC BILATERAL SALPINGECTOMY (Bilateral )     Patient location during evaluation: PACU Anesthesia Type: General Level of consciousness: awake and alert Pain management: pain level controlled Vital Signs Assessment: post-procedure vital signs reviewed and stable Respiratory status: spontaneous breathing, nonlabored ventilation and respiratory function stable Cardiovascular status: blood pressure returned to baseline and stable Postop Assessment: no apparent nausea or vomiting Anesthetic complications: no   No complications documented.  Last Vitals:  Vitals:   04/24/20 1512 04/24/20 1527  BP: 106/62 107/70  Pulse: 75 77  Resp: 14 16  Temp:    SpO2: 100% 100%    Last Pain:  Vitals:   04/24/20 1527  TempSrc:   PainSc: 2                  Maikel Neisler,W. EDMOND

## 2020-04-24 NOTE — Anesthesia Procedure Notes (Signed)
Procedure Name: Intubation Date/Time: 04/24/2020 1:42 PM Performed by: Rande Brunt, CRNA Pre-anesthesia Checklist: Patient identified, Emergency Drugs available, Suction available and Patient being monitored Patient Re-evaluated:Patient Re-evaluated prior to induction Oxygen Delivery Method: Circle System Utilized Preoxygenation: Pre-oxygenation with 100% oxygen Induction Type: IV induction Ventilation: Mask ventilation without difficulty Laryngoscope Size: Mac and 4 Grade View: Grade I Tube type: Oral Tube size: 7.0 mm Number of attempts: 1 Airway Equipment and Method: Stylet Placement Confirmation: ETT inserted through vocal cords under direct vision,  positive ETCO2 and breath sounds checked- equal and bilateral Secured at: 21 cm Tube secured with: Tape Dental Injury: Teeth and Oropharynx as per pre-operative assessment

## 2020-04-24 NOTE — Transfer of Care (Signed)
Immediate Anesthesia Transfer of Care Note  Patient: Jamie Vang  Procedure(s) Performed: LAPAROSCOPIC BILATERAL SALPINGECTOMY (Bilateral )  Patient Location: PACU  Anesthesia Type:General  Level of Consciousness: awake, alert , oriented and drowsy  Airway & Oxygen Therapy: Patient Spontanous Breathing  Post-op Assessment: Report given to RN, Post -op Vital signs reviewed and stable and Patient moving all extremities  Post vital signs: Reviewed and stable  Last Vitals:  Vitals Value Taken Time  BP 111/72   Temp    Pulse 95 04/24/20 1458  Resp 12 04/24/20 1458  SpO2 98 % 04/24/20 1458  Vitals shown include unvalidated device data.  Last Pain:  Vitals:   04/24/20 1129  TempSrc:   PainSc: 0-No pain         Complications: No complications documented.

## 2020-04-24 NOTE — Op Note (Signed)
Preop Diagnosis: Desires Surgical Sterilzation  Postop Diagnosis: Desires Surgical Sterilzation  Procedure: LAPAROSCOPIC BILATERAL SALPINGECTOMY  Anesthesia: General   Attending: Purcell Nails, MD   Assistant: Henreitta Leber, PA-C  Findings: Normal appearing bilateral ovaries and tubes  Pathology: Bilateral fallopian tubes  Fluids: 1000 cc  UOP: 300 cc  EBL: 5 cc  Complications: None  Procedure: The patient was taken to the operating room after the risks, benefits, alternatives, complications, treatment options, and expected outcomes were discussed with the patient. The patient verbalized understanding, the patient concurred with the proposed plan and consent signed and witnessed. The patient was taken to the Operating Room, identified as Jamie Vang  and procedure verified as sterilization procedure via laparoscopic bilateral salpingectomy. A Time Out was held and the above information confirmed.  The patient was placed under general anesthesia per anesthesia staff, the patient was placed in modified dorsal lithotomy position and was prepped, draped, and catheterized in the normal, sterile fashion.  The cervix was visualized and an intrauterine manipulator was placed. A  10 mm umbilical incision was then performed. Veress needle was passed and pneumoperitoneum was established. A 10 mm trocar was advanced into the intraabdominal cavity, the laparoscope was introduced and findings as noted above.  A 59mm incision was made suprapubically and 39mm trocar advanced into the intraabdominal cavity under direct visualization.  The same was done in the LLQ.  The right fallopian tube was identified and carried out to its fimbriated end and excised with the Ligasure.  The same was done on the contralateral side.  The laparoscope was removed and pneumoperitoneum released.  The fascia was repaired with a figure of eight stitch of 0 vicryl and the skin was reapproximated with 3-0  monocryl via subcuticular stitch.  Dermabond was applied to close the 34mm incisions and used to reinforce the 67mm umbilical incision.  Dressing placed over 29mm umbilical incision as well.  Sponge, lap and needle counts were correct.  Patient tolerated the procedure well and was returned to the recovery room in good condition.

## 2020-04-24 NOTE — Discharge Instructions (Signed)
Call [facility] @ [phone] if:  You have a temperature greater than or equal to 100.4 degrees Farenheit orally You have pain that is not made better by the pain medication given and taken as directed You have excessive bleeding or problems urinating  Take Colace (Docusate Sodium/Stool Softener) 100 mg 2-3 times daily while taking narcotic pain medicine to avoid constipation or until bowel movements are regular. Take Ibuprofen 600 mg with food, every 6 hours x 3 days then as needed for pain (first dose 8:30 pm day of surgery)  You may drive after 24 hours You may walk up steps  You may shower tomorrow You may resume a regular diet  Keep incisions clean and dry Do not lift over 15 pounds for 6 weeks Avoid anything in vagina for until after your post-operative visit

## 2020-04-25 ENCOUNTER — Encounter (HOSPITAL_COMMUNITY): Payer: Self-pay | Admitting: Obstetrics and Gynecology

## 2020-04-27 LAB — SURGICAL PATHOLOGY

## 2020-06-01 ENCOUNTER — Encounter (HOSPITAL_BASED_OUTPATIENT_CLINIC_OR_DEPARTMENT_OTHER): Payer: Self-pay | Admitting: *Deleted

## 2020-06-01 ENCOUNTER — Emergency Department (HOSPITAL_BASED_OUTPATIENT_CLINIC_OR_DEPARTMENT_OTHER)
Admission: EM | Admit: 2020-06-01 | Discharge: 2020-06-01 | Disposition: A | Discharge status: Home / Self Care | Payer: No Typology Code available for payment source | Attending: Emergency Medicine | Admitting: Emergency Medicine

## 2020-06-01 ENCOUNTER — Other Ambulatory Visit: Payer: Self-pay

## 2020-06-01 DIAGNOSIS — Z87891 Personal history of nicotine dependence: Secondary | ICD-10-CM | POA: Diagnosis not present

## 2020-06-01 DIAGNOSIS — Z8616 Personal history of COVID-19: Secondary | ICD-10-CM | POA: Insufficient documentation

## 2020-06-01 DIAGNOSIS — M545 Low back pain, unspecified: Secondary | ICD-10-CM | POA: Diagnosis present

## 2020-06-01 DIAGNOSIS — J45909 Unspecified asthma, uncomplicated: Secondary | ICD-10-CM | POA: Insufficient documentation

## 2020-06-01 DIAGNOSIS — M5441 Lumbago with sciatica, right side: Secondary | ICD-10-CM

## 2020-06-01 DIAGNOSIS — X500XXA Overexertion from strenuous movement or load, initial encounter: Secondary | ICD-10-CM | POA: Diagnosis not present

## 2020-06-01 MED ORDER — CYCLOBENZAPRINE HCL 10 MG PO TABS: 10.0000 mg | ORAL_TABLET | Freq: Every day | ORAL | 0 refills | Status: AC

## 2020-06-01 MED ORDER — PREDNISONE 10 MG (21) PO TBPK: ORAL_TABLET | Freq: Every day | ORAL | 0 refills | Status: DC

## 2020-06-01 MED ORDER — DEXAMETHASONE SODIUM PHOSPHATE 10 MG/ML IJ SOLN
8.0000 mg | Freq: Once | INTRAMUSCULAR | Status: AC
  Administered 2020-06-01: 8 mg via INTRAMUSCULAR
  Filled 2020-06-01: qty 1

## 2020-06-01 MED ORDER — KETOROLAC TROMETHAMINE 15 MG/ML IJ SOLN
15.0000 mg | Freq: Once | INTRAMUSCULAR | Status: AC
  Administered 2020-06-01: 15 mg via INTRAMUSCULAR
  Filled 2020-06-01: qty 1

## 2020-06-01 NOTE — ED Provider Notes (Signed)
[facility] EMERGENCY DEPARTMENT Provider Note   CSN: [id number] Arrival date & time: 06/01/20  1247     History No chief complaint on file.   Jamie Vang is a 40 y.o. female.  HPI Patient is a 40 year old female with a medical history as noted below.  She presents to the emergency department due to low back pain.  She states she was pulled starting a lawnmower just prior to arrival.  She states after starting the lawnmower she immediately began having her back pain.  Pain worsens with any movement.  She reports a history of sciatica and states her current symptoms are similar to prior sciatica exacerbations.  She reports radiation of pain down her bilateral lower extremities.  Denies any numbness, weakness, saddle anesthesia, bowel or bladder incontinence, dysuria.    Past Medical History:  Diagnosis Date  . Anxiety   . Asthma   . COVID-19   . Depression   . PCOS (polycystic ovarian syndrome)     There are no problems to display for this patient.   Past Surgical History:  Procedure Laterality Date  . CESAREAN SECTION    . CESAREAN SECTION    . KNEE SURGERY    . LAPAROSCOPIC BILATERAL SALPINGECTOMY Bilateral 04/24/2020   Procedure: LAPAROSCOPIC BILATERAL SALPINGECTOMY;  Surgeon: Osborn Coho, MD;  Location: [facility] OR;  Service: Gynecology;  Laterality: Bilateral;     OB History    Gravida  1   Para      Term      Preterm      AB      Living        SAB      IAB      Ectopic      Multiple      Live Births              Family History  Problem Relation Age of Onset  . Diabetes Mother   . Heart failure Mother   . Hypertension Mother     Social History   Tobacco Use  . Smoking status: Former Smoker    Types: Cigars  . Smokeless tobacco: Never Used  Vaping Use  . Vaping Use: Never used  Substance Use Topics  . Alcohol use: Yes    Comment: occasionally  . Drug use: Never    Home Medications Prior to Admission medications    Medication Sig Start Date End Date Taking? Authorizing Provider  cyclobenzaprine (FLEXERIL) 10 MG tablet Take 1 tablet (10 mg total) by mouth at bedtime for 10 days. 06/01/20 06/11/20 Yes Placido Sou, PA-C  predniSONE (STERAPRED UNI-PAK 21 TAB) 10 MG (21) TBPK tablet Take by mouth daily. Take 6 tabs by mouth daily  for 2 days, then 5 tabs for 2 days, then 4 tabs for 2 days, then 3 tabs for 2 days, 2 tabs for 2 days, then 1 tab by mouth daily for 2 days 06/01/20  Yes Placido Sou, PA-C  albuterol (VENTOLIN HFA) 108 (90 Base) MCG/ACT inhaler Inhale 1-2 puffs into the lungs every 6 (six) hours as needed for wheezing or shortness of breath.    [provider]  baclofen (LIORESAL) 10 MG tablet Take 10 mg by mouth 3 (three) times daily as needed for muscle spasms. 02/14/19 06/08/20  [provider]  Cholecalciferol (VITAMIN D3) 50 MCG (2000 UT) TABS Take 2,000 Units by mouth in the morning.    [provider]  ibuprofen (ADVIL) 600 MG tablet take 1 tablet po  pc every 6 hours for 3 days then prn-post operative pain (1st dose 8:30 p.m.) 04/24/20   Henreitta Leber, PA-C  metoCLOPramide (REGLAN) 10 MG tablet Take 1 tablet (10 mg total) by mouth every 8 (eight) hours as needed for nausea. Patient not taking: Reported on 04/16/2020 02/01/20   Army Melia A, PA-C  ondansetron (ZOFRAN ODT) 4 MG disintegrating tablet Take 1 tablet (4 mg total) by mouth every 8 (eight) hours as needed for nausea or vomiting. Patient not taking: Reported on 04/16/2020 01/11/20   Sabas Sous, MD  oxyCODONE-acetaminophen (PERCOCET/ROXICET) 5-325 MG tablet take 1 tablet po every 6 hours as needed for breakthrough post operative pain 04/24/20   Henreitta Leber, PA-C    Allergies    Patient has no known allergies.  Review of Systems   Review of Systems  Genitourinary: Negative for difficulty urinating, dysuria and frequency.  Musculoskeletal: Positive for back pain and myalgias.  Neurological: Negative for  weakness and numbness.   Physical Exam Updated Vital Signs BP 119/68 (BP Location: Left Arm)   Pulse 99   Temp 98.5 F (36.9 C) (Oral)   Resp 16   Ht 5\' 8"  (1.727 m)   Wt 83.9 kg   LMP 03/22/2020   SpO2 95%   Breastfeeding Unknown   BMI 28.13 kg/m   Physical Exam Vitals and nursing note reviewed.  Constitutional:      General: She is not in acute distress.    Appearance: She is well-developed.  HENT:     Head: Normocephalic and atraumatic.     Right Ear: External ear normal.     Left Ear: External ear normal.  Eyes:     General: No scleral icterus.       Right eye: No discharge.        Left eye: No discharge.     Conjunctiva/sclera: Conjunctivae normal.  Neck:     Trachea: No tracheal deviation.  Cardiovascular:     Rate and Rhythm: Normal rate.  Pulmonary:     Effort: Pulmonary effort is normal. No respiratory distress.     Breath sounds: No stridor.  Abdominal:     General: There is no distension.  Musculoskeletal:        General: Tenderness present. No swelling or deformity.     Cervical back: Neck supple.     Comments: Moderate TTP noted diffusely in the lumbar region.  No crepitus, step-offs, or deformities.  Skin:    General: Skin is warm and dry.     Findings: No rash.  Neurological:     General: No focal deficit present.     Mental Status: She is alert and oriented to person, place, and time.     Cranial Nerves: Cranial nerve deficit: no gross deficits.     Comments: Distal sensation intact in the lower extremities.  No gross deficits.  2+ pedal pulses.  Psychiatric:        Mood and Affect: Mood normal.        Behavior: Behavior normal.    ED Results / Procedures / Treatments   Labs (all labs ordered are listed, but only abnormal results are displayed) Labs Reviewed - No data to display  EKG None  Radiology No results found.  Procedures Procedures   Medications Ordered in ED Medications  ketorolac (TORADOL) 15 MG/ML injection 15 mg (15  mg Intramuscular Given 06/01/20 1323)  dexamethasone (DECADRON) injection 8 mg (8 mg Intramuscular Given 06/01/20 1323)    ED Course  I have reviewed the triage vital signs and the nursing notes.  Pertinent labs & imaging results that were available during my care of the patient were reviewed by me and considered in my medical decision making (see chart for details).    MDM Rules/Calculators/A&P                          Patient is a 40 year old female with a history of sciatica who presents to the emergency department with what appears to be bilateral sciatica after using a pull start on a lawnmower just prior to arrival.  Patient has tenderness diffusely in the lumbar region.  She is neurovascularly intact in the lower extremities.  Denies any numbness, weakness, saddle anesthesia, bowel or bladder incontinence.  No red flags.  Doubt cauda equina at this time.  Patient given Toradol as well as Decadron for her acute pain.  Will discharge on a course of prednisone as well as Flexeril.  She denies a history of diabetes mellitus.  Patient had a recent salpingectomy and denies any possibility of pregnancy.  We discussed Flexeril is not safe to take during pregnancy.  Feel that patient is stable for discharge at this time and she is agreeable.  She was given a referral to orthopedics if she finds that her symptoms are refractory.  Discussed return precautions.  Her questions were answered and she was amicable at the time of discharge.  Final Clinical Impression(s) / ED Diagnoses Final diagnoses:  Acute bilateral low back pain with bilateral sciatica   Rx / DC Orders ED Discharge Orders         Ordered    predniSONE (STERAPRED UNI-PAK 21 TAB) 10 MG (21) TBPK tablet  Daily        06/01/20 1317    cyclobenzaprine (FLEXERIL) 10 MG tablet  Daily at bedtime        06/01/20 1317           Placido Sou, PA-C 06/01/20 1326    Gwyneth Sprout, MD 06/03/20 1818

## 2020-06-01 NOTE — ED Triage Notes (Signed)
Lower back pain after starting lawn mower. Hx of chronic pain.

## 2020-06-01 NOTE — Discharge Instructions (Signed)
I am prescribing you a strong muscle relaxer called flexeril. Please only take this medication once in the evening with dinner. This medication can make you quite drowsy. Do not mix it with alcohol. Do not drive a vehicle after taking it.   I am also prescribing you a prednisone taper.  This is a steroid medication.  Please start taking this tomorrow in the morning with breakfast.  This medication can be stimulating so try not to take it later in the day as it can make it difficult to sleep.  Below is the contact information for a local orthopedic doctor.  Please give them a call and schedule an appointment for reevaluation.  If your symptoms worsen, please come back to the emergency department for reevaluation.  It was a pleasure to meet you.

## 2020-07-21 ENCOUNTER — Emergency Department (HOSPITAL_BASED_OUTPATIENT_CLINIC_OR_DEPARTMENT_OTHER)
Admission: EM | Admit: 2020-07-21 | Discharge: 2020-07-21 | Disposition: A | Discharge status: Home / Self Care | Payer: No Typology Code available for payment source | Attending: Emergency Medicine | Admitting: Emergency Medicine

## 2020-07-21 ENCOUNTER — Encounter (HOSPITAL_BASED_OUTPATIENT_CLINIC_OR_DEPARTMENT_OTHER): Payer: Self-pay | Admitting: Emergency Medicine

## 2020-07-21 ENCOUNTER — Other Ambulatory Visit: Payer: Self-pay

## 2020-07-21 DIAGNOSIS — M5412 Radiculopathy, cervical region: Secondary | ICD-10-CM | POA: Insufficient documentation

## 2020-07-21 DIAGNOSIS — Z87891 Personal history of nicotine dependence: Secondary | ICD-10-CM | POA: Insufficient documentation

## 2020-07-21 DIAGNOSIS — Z8616 Personal history of COVID-19: Secondary | ICD-10-CM | POA: Diagnosis not present

## 2020-07-21 DIAGNOSIS — M542 Cervicalgia: Secondary | ICD-10-CM | POA: Diagnosis present

## 2020-07-21 DIAGNOSIS — J45909 Unspecified asthma, uncomplicated: Secondary | ICD-10-CM | POA: Diagnosis not present

## 2020-07-21 MED ORDER — METHYLPREDNISOLONE 4 MG PO TBPK: ORAL_TABLET | ORAL | 0 refills | Status: DC

## 2020-07-21 MED ORDER — CYCLOBENZAPRINE HCL 10 MG PO TABS: 10.0000 mg | ORAL_TABLET | Freq: Two times a day (BID) | ORAL | 0 refills | Status: DC | PRN

## 2020-07-21 MED ORDER — HYDROCODONE-ACETAMINOPHEN 5-325 MG PO TABS: 1.0000 | ORAL_TABLET | ORAL | 0 refills | Status: DC | PRN

## 2020-07-21 NOTE — ED Provider Notes (Signed)
[facility] EMERGENCY DEPARTMENT Provider Note   CSN: [id number] Arrival date & time: 07/21/20  0849     History Chief Complaint  Patient presents with   Shoulder Pain    Jamie Vang is a 40 y.o. female.  HPI     40yo female with history of PCOS, asthma, back pain with DDD, lumbosacral spondylosis previously seeing pain management presents with concern for neck pain with radiation down left arm.   Was stretching and felt a pop 2 days ago with immediate pain to left side of neck/shoulder/scapula and radiation down the arm.  Reports tingling to thumb area. No weakness, just pain with movement. No other falls or injury. No fever.   Past Medical History:  Diagnosis Date   Anxiety    Asthma    COVID-19    Depression    PCOS (polycystic ovarian syndrome)     There are no problems to display for this patient.   Past Surgical History:  Procedure Laterality Date   CESAREAN SECTION     CESAREAN SECTION     KNEE SURGERY     LAPAROSCOPIC BILATERAL SALPINGECTOMY Bilateral 04/24/2020   Procedure: LAPAROSCOPIC BILATERAL SALPINGECTOMY;  Surgeon: Osborn Coho, MD;  Location: [facility] OR;  Service: Gynecology;  Laterality: Bilateral;     OB History     Gravida  1   Para      Term      Preterm      AB      Living         SAB      IAB      Ectopic      Multiple      Live Births              Family History  Problem Relation Age of Onset   Diabetes Mother    Heart failure Mother    Hypertension Mother     Social History   Tobacco Use   Smoking status: Former    Types: Cigars   Smokeless tobacco: Never  Vaping Use   Vaping Use: Never used  Substance Use Topics   Alcohol use: Yes    Comment: occasionally   Drug use: Never    Home Medications Prior to Admission medications   Medication Sig Start Date End Date Taking? Authorizing Provider  cyclobenzaprine (FLEXERIL) 10 MG tablet Take 1 tablet (10 mg total) by mouth 2 (two) times  daily as needed for muscle spasms. 07/21/20  Yes Alvira Monday, MD  HYDROcodone-acetaminophen (NORCO/VICODIN) 5-325 MG tablet Take 1 tablet by mouth every 4 (four) hours as needed. 07/21/20  Yes Alvira Monday, MD  methylPREDNISolone (MEDROL DOSEPAK) 4 MG TBPK tablet See instructions on package. 07/21/20  Yes Alvira Monday, MD  albuterol (VENTOLIN HFA) 108 (90 Base) MCG/ACT inhaler Inhale 1-2 puffs into the lungs every 6 (six) hours as needed for wheezing or shortness of breath.    [provider]  Cholecalciferol (VITAMIN D3) 50 MCG (2000 UT) TABS Take 2,000 Units by mouth in the morning.    [provider]  ibuprofen (ADVIL) 600 MG tablet take 1 tablet po pc every 6 hours for 3 days then prn-post operative pain (1st dose 8:30 p.m.) 04/24/20   Henreitta Leber, PA-C  metoCLOPramide (REGLAN) 10 MG tablet Take 1 tablet (10 mg total) by mouth every 8 (eight) hours as needed for nausea. Patient not taking: Reported on 04/16/2020 02/01/20   Army Melia A, PA-C  ondansetron (ZOFRAN ODT) 4 MG  disintegrating tablet Take 1 tablet (4 mg total) by mouth every 8 (eight) hours as needed for nausea or vomiting. Patient not taking: Reported on 04/16/2020 01/11/20   Sabas Sous, MD  predniSONE (STERAPRED UNI-PAK 21 TAB) 10 MG (21) TBPK tablet Take by mouth daily. Take 6 tabs by mouth daily  for 2 days, then 5 tabs for 2 days, then 4 tabs for 2 days, then 3 tabs for 2 days, 2 tabs for 2 days, then 1 tab by mouth daily for 2 days 06/01/20   Placido Sou, PA-C    Allergies    Patient has no known allergies.  Review of Systems   Review of Systems  Constitutional:  Negative for fever.  HENT:  Negative for sore throat.   Eyes:  Negative for visual disturbance.  Respiratory:  Negative for cough and shortness of breath.   Cardiovascular:  Negative for chest pain.  Gastrointestinal:  Negative for abdominal pain.  Genitourinary:  Negative for difficulty urinating.  Musculoskeletal:  Positive  for arthralgias. Negative for back pain and neck pain.  Skin:  Negative for rash.  Neurological:  Negative for syncope, facial asymmetry, weakness and headaches. Numbness: tingling to thumb.  Physical Exam Updated Vital Signs BP 121/82 (BP Location: Left Arm)   Pulse 73   Temp 97.7 F (36.5 C)   Resp 18   Ht 5\' 8"  (1.727 m)   Wt 83.9 kg   SpO2 99%   BMI 28.13 kg/m   Physical Exam Vitals and nursing note reviewed.  Constitutional:      General: She is not in acute distress.    Appearance: Normal appearance. She is not ill-appearing, toxic-appearing or diaphoretic.  HENT:     Head: Normocephalic.  Eyes:     Conjunctiva/sclera: Conjunctivae normal.  Cardiovascular:     Rate and Rhythm: Normal rate and regular rhythm.     Pulses: Normal pulses.  Pulmonary:     Effort: Pulmonary effort is normal. No respiratory distress.  Musculoskeletal:        General: Tenderness (left sided neck, shoulder) present. No deformity or signs of injury.     Cervical back: No rigidity.     Comments: Normal strength opponens, wrist extension, finger abduction, grip, elbow flexion and extension, abduction shoulder although with pain Reports abnormal sensation radial side of hand/thumb   Skin:    General: Skin is warm and dry.     Coloration: Skin is not jaundiced or pale.  Neurological:     General: No focal deficit present.     Mental Status: She is alert and oriented to person, place, and time.    ED Results / Procedures / Treatments   Labs (all labs ordered are listed, but only abnormal results are displayed) Labs Reviewed - No data to display  EKG None  Radiology No results found.  Procedures Procedures   Medications Ordered in ED Medications - No data to display  ED Course  I have reviewed the triage vital signs and the nursing notes.  Pertinent labs & imaging results that were available during my care of the patient were reviewed by me and considered in my medical decision  making (see chart for details).    MDM Rules/Calculators/A&P                          39yo female with history of PCOS, asthma, back pain with DDD, lumbosacral spondylosis previously seeing pain management presents with concern for  neck pain with radiation down left arm.  Do not suspect fracture, abscess, septic arthritis, dissection, DVT, acute arterial thrombus by history and exam. Suspect most likely cervical radiculopathy.  Discussed risks of narcotic pain medications and reviewed in [place] drug database.  GIven rx for medrol flexeril, small rx for norco. Recommend follow up with her PCP, spine doctor, pain doctor. Patient discharged in stable condition with understanding of reasons to return.   Final Clinical Impression(s) / ED Diagnoses Final diagnoses:  Cervical radiculopathy    Rx / DC Orders ED Discharge Orders          Ordered    methylPREDNISolone (MEDROL DOSEPAK) 4 MG TBPK tablet        07/21/20 1100    cyclobenzaprine (FLEXERIL) 10 MG tablet  2 times daily PRN        07/21/20 1100    HYDROcodone-acetaminophen (NORCO/VICODIN) 5-325 MG tablet  Every 4 hours PRN        07/21/20 1100             Alvira Monday, MD 07/23/20 [phone]

## 2020-07-21 NOTE — ED Notes (Signed)
AVS reviewed with client, each Rx reviewed and enforced being safe while taking muscle relaxant and opioid pain med. Sling applied by EMT/NT, sling care teaching provided as well. Opportunity for questions provided prior to DC.

## 2020-07-21 NOTE — ED Triage Notes (Signed)
Pt arrives pov with c/o L scapula pain after stretching x 2 days pta. Pt reports burning, tingling in L hand. Pt denies injury. Pt endorses 400 mg ibuprofen at 0600 today with no relief

## 2022-01-19 IMAGING — DX DG KNEE COMPLETE 4+V*L*
4 series · 4 of 4 positions shown · non-contrast
Comparison: None.

CLINICAL DATA: Left knee pain for 2 days, atraumatic

EXAM:
LEFT KNEE - COMPLETE 4+ VIEW

[knee ap]
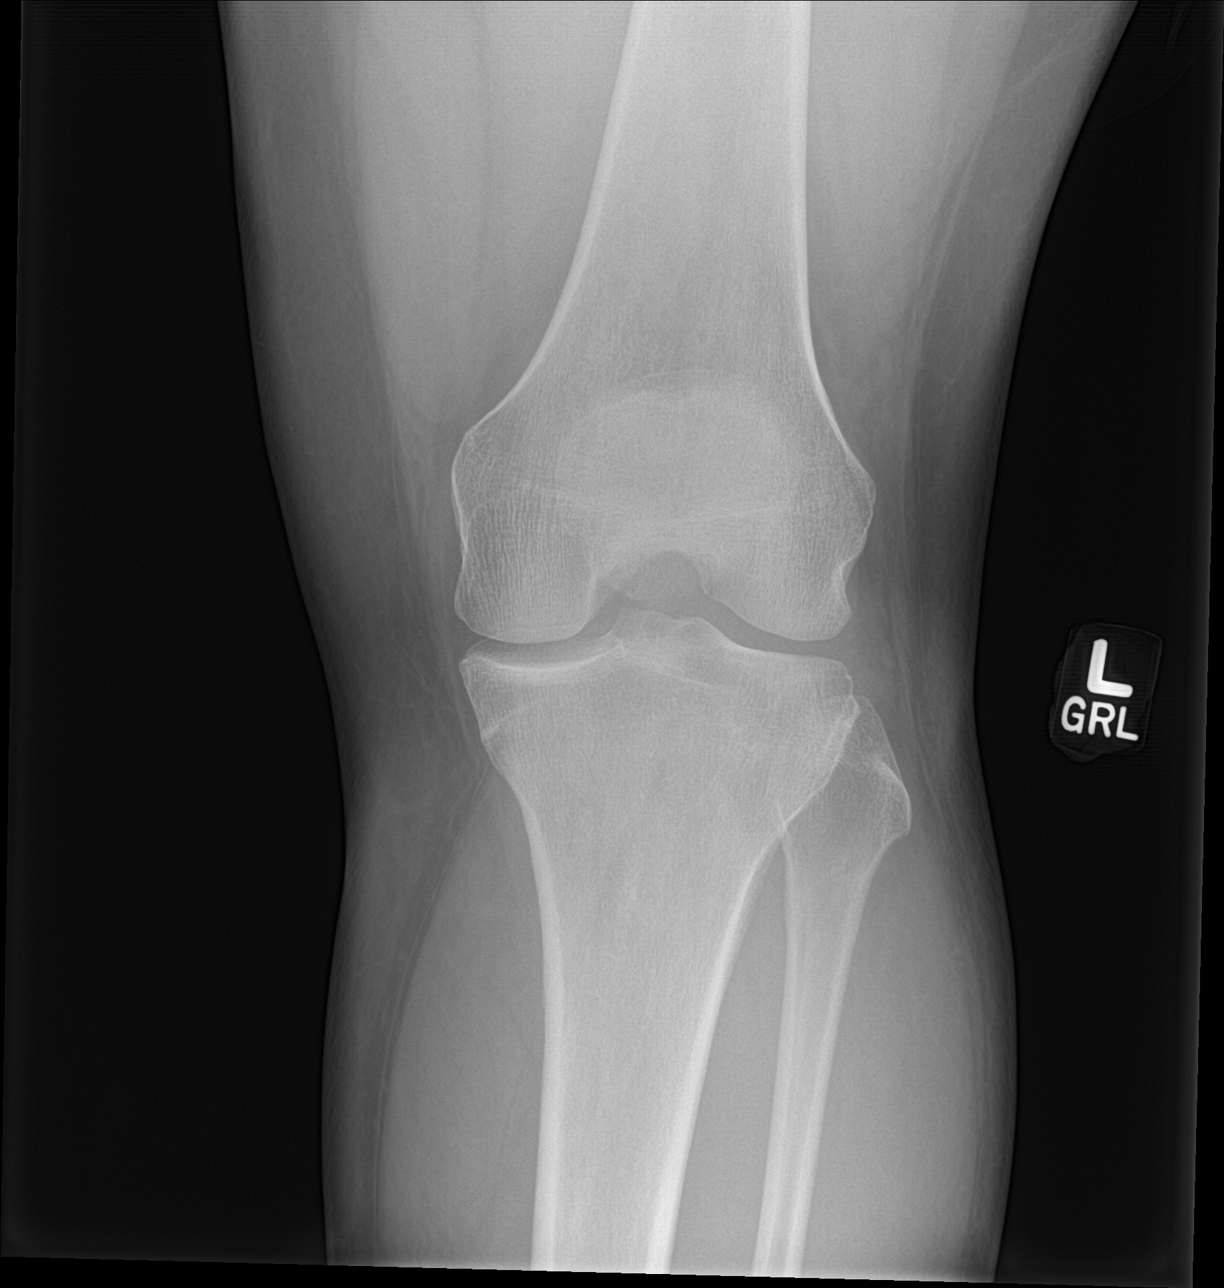

[knee lat]
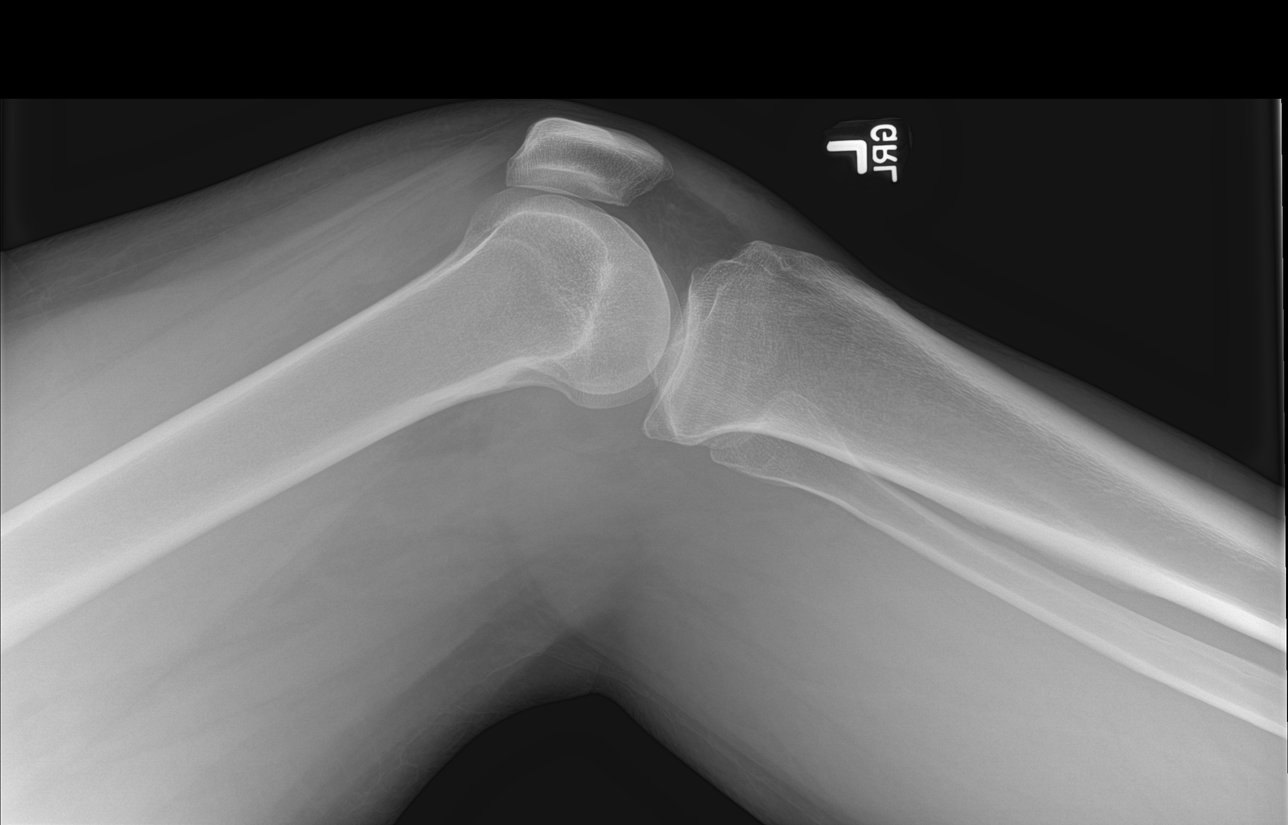

[knee obl (1 of 2)]
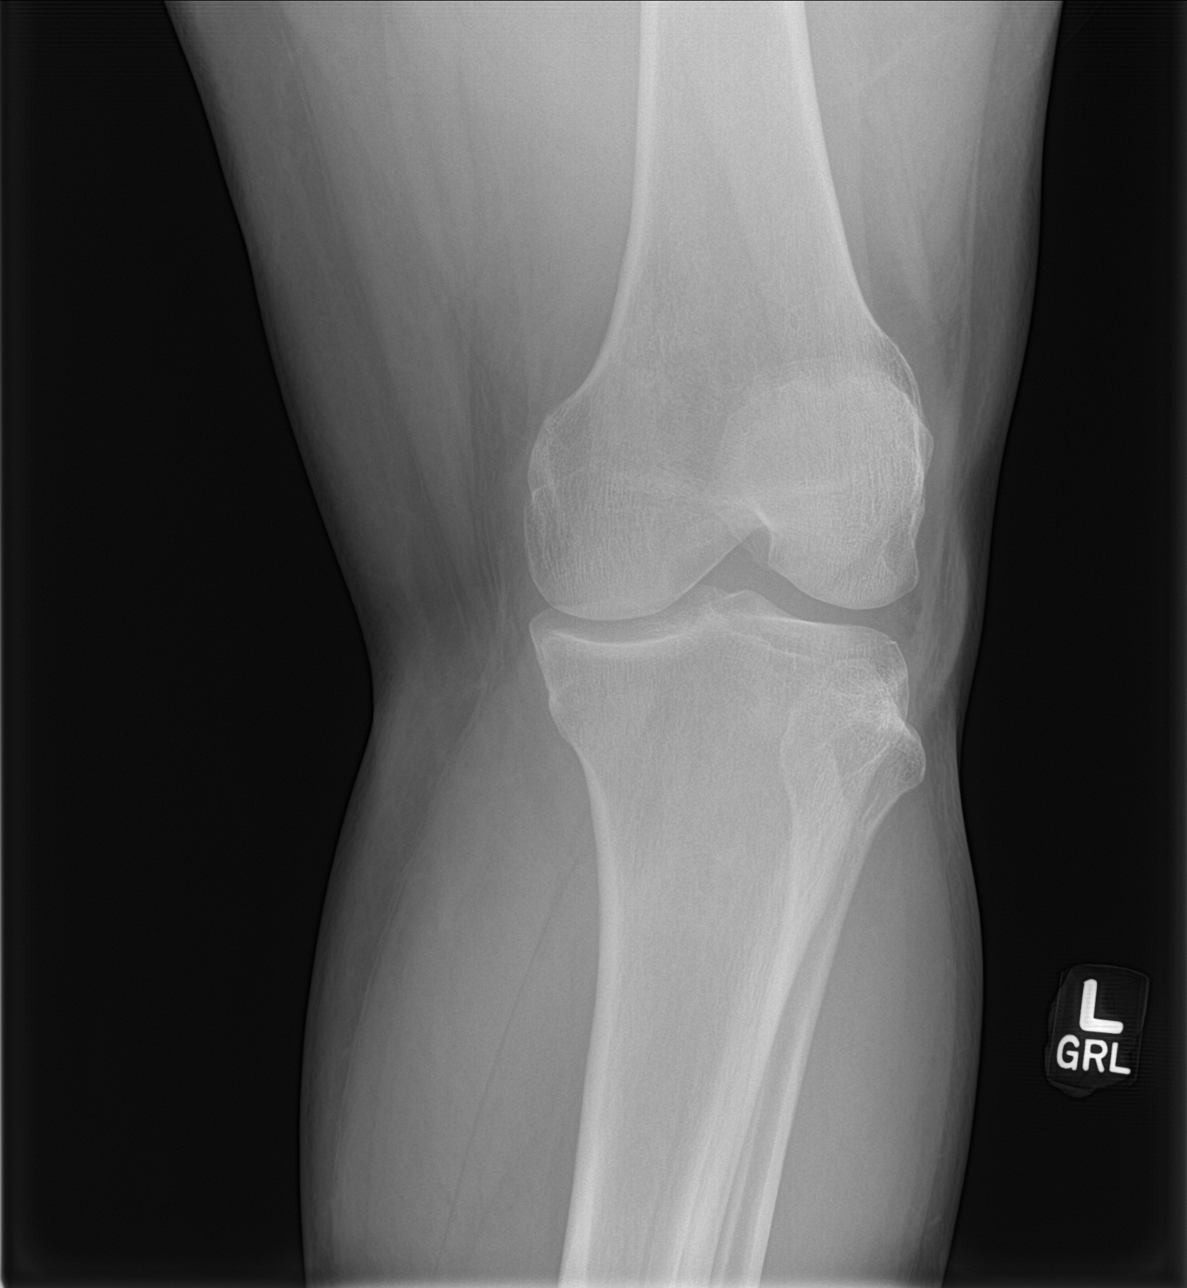

[knee obl (2 of 2)]
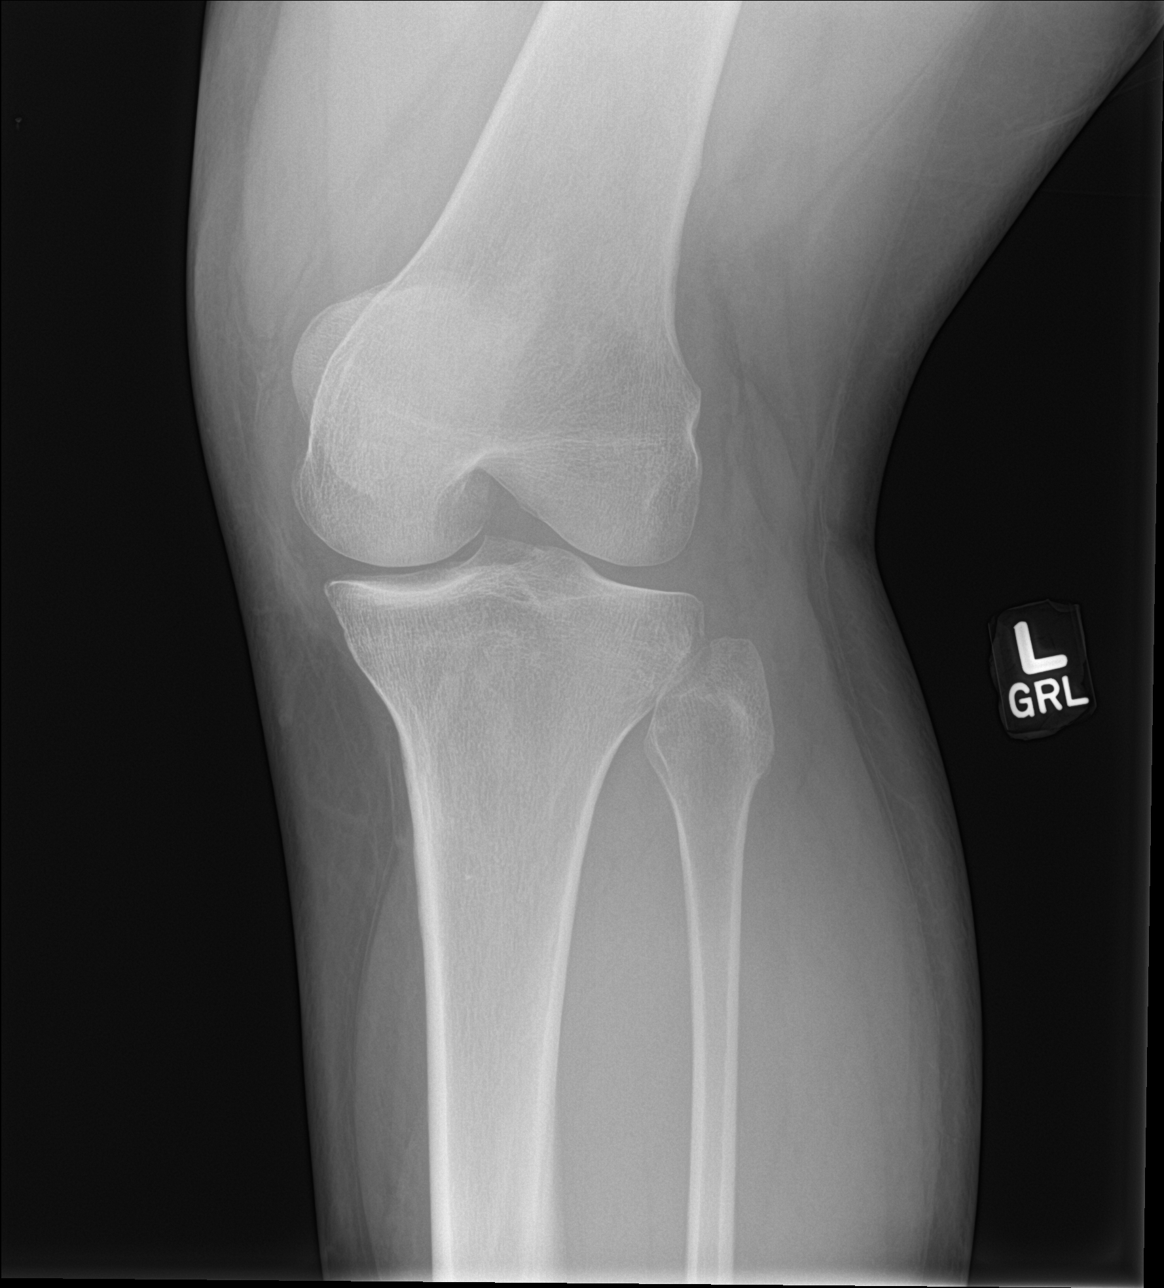

[4 of 4 positions shown; findings below may reference images not displayed]

FINDINGS: No evidence of fracture, dislocation, or joint effusion. No evidence
of arthropathy or other focal bone abnormality. Soft tissues are
unremarkable.
IMPRESSION: Negative.

## 2023-10-22 ENCOUNTER — Emergency Department (HOSPITAL_BASED_OUTPATIENT_CLINIC_OR_DEPARTMENT_OTHER)
Admission: EM | Admit: 2023-10-22 | Discharge: 2023-10-23 | Disposition: A | Discharge status: Home / Self Care | Attending: Emergency Medicine | Admitting: Emergency Medicine

## 2023-10-22 ENCOUNTER — Encounter (HOSPITAL_BASED_OUTPATIENT_CLINIC_OR_DEPARTMENT_OTHER): Payer: Self-pay | Admitting: Emergency Medicine

## 2023-10-22 ENCOUNTER — Other Ambulatory Visit: Payer: Self-pay

## 2023-10-22 DIAGNOSIS — Z79899 Other long term (current) drug therapy: Secondary | ICD-10-CM | POA: Insufficient documentation

## 2023-10-22 DIAGNOSIS — L509 Urticaria, unspecified: Secondary | ICD-10-CM | POA: Insufficient documentation

## 2023-10-22 MED ORDER — FAMOTIDINE 20 MG PO TABS
20.0000 mg | ORAL_TABLET | Freq: Once | ORAL | Status: AC
  Administered 2023-10-22: 20 mg via ORAL
  Filled 2023-10-22: qty 1

## 2023-10-22 MED ORDER — PREDNISONE 50 MG PO TABS: 50.0000 mg | ORAL_TABLET | Freq: Every day | ORAL | 0 refills | Status: DC

## 2023-10-22 MED ORDER — FAMOTIDINE 20 MG PO TABS: 20.0000 mg | ORAL_TABLET | Freq: Two times a day (BID) | ORAL | 0 refills | Status: DC

## 2023-10-22 MED ORDER — PREDNISONE 50 MG PO TABS
60.0000 mg | ORAL_TABLET | Freq: Once | ORAL | Status: AC
  Administered 2023-10-22: 60 mg via ORAL
  Filled 2023-10-22: qty 1

## 2023-10-22 NOTE — Discharge Instructions (Addendum)
Zyrtec twice daily

## 2023-10-22 NOTE — ED Triage Notes (Signed)
 Pt c/o hives all over since 12pm. Pt denies any new foods, soaps, or laundry detergents. Last took benadryl  at 2100.

## 2023-10-22 NOTE — ED Provider Notes (Signed)
 [place] [facility]ERGENCY DEPARTMENT AT [facility] Provider Note   CSN: [id number] Arrival date & time: 10/22/23  2258     Patient presents with: Urticaria   Jamie Vang is a 43 y.o. female.   The history is provided by the patient.  Urticaria This is a new problem. The current episode started 12 to 24 hours ago. The problem occurs constantly. The problem has not changed since onset.Pertinent negatives include no chest pain, no abdominal pain, no headaches and no shortness of breath. Nothing aggravates the symptoms. Nothing relieves the symptoms. Treatments tried: benadryl  at 9 pm. The treatment provided no relief.  Patient denies new food, soap, cosmetic or detergent exposures.       Prior to Admission medications   Medication Sig Start Date End Date Taking? Authorizing Provider  famotidine (PEPCID) 20 MG tablet Take 1 tablet (20 mg total) by mouth 2 (two) times daily. 10/22/23  Yes Lian Tanori, MD  predniSONE  (DELTASONE ) 50 MG tablet Take 1 tablet (50 mg total) by mouth daily with breakfast. 10/22/23  Yes Ameir Faria, MD  albuterol (VENTOLIN HFA) 108 (90 Base) MCG/ACT inhaler Inhale 1-2 puffs into the lungs every 6 (six) hours as needed for wheezing or shortness of breath.    [provider]  Cholecalciferol (VITAMIN D3) 50 MCG (2000 UT) TABS Take 2,000 Units by mouth in the morning.    [provider]  cyclobenzaprine  (FLEXERIL ) 10 MG tablet Take 1 tablet (10 mg total) by mouth 2 (two) times daily as needed for muscle spasms. 07/21/20   Dreama Longs, MD  HYDROcodone -acetaminophen  (NORCO/VICODIN) 5-325 MG tablet Take 1 tablet by mouth every 4 (four) hours as needed. 07/21/20   Dreama Longs, MD  ibuprofen  (ADVIL ) 600 MG tablet take 1 tablet po pc every 6 hours for 3 days then prn-post operative pain (1st dose 8:30 p.m.) 04/24/20   Perri Bjork, PA-C  methylPREDNISolone  (MEDROL  DOSEPAK) 4 MG TBPK tablet See instructions on package. 07/21/20    Dreama Longs, MD  metoCLOPramide  (REGLAN ) 10 MG tablet Take 1 tablet (10 mg total) by mouth every 8 (eight) hours as needed for nausea. Patient not taking: Reported on 04/16/2020 02/01/20   Beverley Doffing A, PA-C  ondansetron  (ZOFRAN  ODT) 4 MG disintegrating tablet Take 1 tablet (4 mg total) by mouth every 8 (eight) hours as needed for nausea or vomiting. Patient not taking: Reported on 04/16/2020 01/11/20   Theadore Ozell HERO, MD  predniSONE  (STERAPRED UNI-PAK 21 TAB) 10 MG (21) TBPK tablet Take by mouth daily. Take 6 tabs by mouth daily  for 2 days, then 5 tabs for 2 days, then 4 tabs for 2 days, then 3 tabs for 2 days, 2 tabs for 2 days, then 1 tab by mouth daily for 2 days 06/01/20   Joldersma, Logan, PA-C    Allergies: Other    Review of Systems  Respiratory:  Negative for shortness of breath.   Cardiovascular:  Negative for chest pain.  Gastrointestinal:  Negative for abdominal pain.  Skin:  Positive for rash.  Neurological:  Negative for headaches.  All other systems reviewed and are negative.   Updated Vital Signs BP 114/79   Pulse 81   Temp 98.2 F (36.8 C)   Resp 18   Ht 5' 8 (1.727 m)   Wt 83.9 kg   SpO2 98%   BMI 28.12 kg/m   Physical Exam Vitals and nursing note reviewed. Exam conducted with a chaperone present.  Constitutional:  General: She is not in acute distress.    Appearance: Normal appearance. She is well-developed.  HENT:     Head: Normocephalic and atraumatic.     Nose: Nose normal.     Mouth/Throat:     Mouth: Mucous membranes are moist.     Pharynx: Oropharynx is clear.     Comments: No swelling of the lips tongue or uvula  Eyes:     Pupils: Pupils are equal, round, and reactive to light.  Cardiovascular:     Rate and Rhythm: Normal rate and regular rhythm.     Pulses: Normal pulses.     Heart sounds: Normal heart sounds.  Pulmonary:     Effort: Pulmonary effort is normal. No respiratory distress.     Breath sounds: Normal breath sounds.   Abdominal:     General: Bowel sounds are normal. There is no distension.     Palpations: Abdomen is soft.     Tenderness: There is no abdominal tenderness. There is no guarding or rebound.  Musculoskeletal:        General: Normal range of motion.     Cervical back: Neck supple.  Skin:    General: Skin is warm and dry.     Capillary Refill: Capillary refill takes less than 2 seconds.     Findings: No erythema or rash.     Comments: Hives on left flank   Neurological:     General: No focal deficit present.     Mental Status: She is alert and oriented to person, place, and time.     Deep Tendon Reflexes: Reflexes normal.  Psychiatric:        Mood and Affect: Mood normal.     (all labs ordered are listed, but only abnormal results are displayed) Labs Reviewed - No data to display  EKG: None  Radiology: No results found.   Procedures   Medications Ordered in the ED  predniSONE  (DELTASONE ) tablet 60 mg (60 mg Oral Given 10/22/23 2319)  famotidine (PEPCID) tablet 20 mg (20 mg Oral Given 10/22/23 2319)                                    Medical Decision Making Patient with hives since 12 pm   Amount and/or Complexity of Data Reviewed External Data Reviewed: notes.    Details: [facility] ED visit reviewed   Risk Prescription drug management. Risk Details: Well appearing.  Lungs are clear, normal BP, no lip or face swelling.  Medications administered.  Start Zyrtec twice daily in am. RX for prednisone  and pepcid BID as a secondary anti-histamine.    Stable for discharge with close follow up.       Final diagnoses:  Hives   No signs of systemic illness or infection. The patient is nontoxic-appearing on exam and vital signs are within normal limits.  I have reviewed the triage vital signs and the nursing notes. Pertinent labs & imaging results that were available during my care of the patient were reviewed by me and considered in my medical decision making (see chart for  details). After history, exam, and medical workup I feel the patient has been appropriately medically screened and is safe for discharge home. Pertinent diagnoses were discussed with the patient. Patient was given return precautions.  ED Discharge Orders          Ordered    famotidine (PEPCID) 20 MG tablet  2 times daily        10/22/23 2349    predniSONE  (DELTASONE ) 50 MG tablet  Daily with breakfast        10/22/23 2349               Matylda Fehring, MD 10/22/23 2354

## 2023-11-27 ENCOUNTER — Other Ambulatory Visit: Payer: Self-pay

## 2023-11-27 ENCOUNTER — Encounter (HOSPITAL_BASED_OUTPATIENT_CLINIC_OR_DEPARTMENT_OTHER): Payer: Self-pay | Admitting: Emergency Medicine

## 2023-11-27 ENCOUNTER — Observation Stay (HOSPITAL_BASED_OUTPATIENT_CLINIC_OR_DEPARTMENT_OTHER)
Admission: EM | Admit: 2023-11-27 | Discharge: 2023-11-28 | Disposition: A | Discharge status: Home / Self Care | Attending: Hospitalist | Admitting: Hospitalist

## 2023-11-27 DIAGNOSIS — Z79899 Other long term (current) drug therapy: Secondary | ICD-10-CM | POA: Diagnosis not present

## 2023-11-27 DIAGNOSIS — K130 Diseases of lips: Secondary | ICD-10-CM | POA: Diagnosis not present

## 2023-11-27 DIAGNOSIS — J45909 Unspecified asthma, uncomplicated: Secondary | ICD-10-CM | POA: Insufficient documentation

## 2023-11-27 DIAGNOSIS — T7840XA Allergy, unspecified, initial encounter: Secondary | ICD-10-CM | POA: Diagnosis not present

## 2023-11-27 DIAGNOSIS — Z87891 Personal history of nicotine dependence: Secondary | ICD-10-CM | POA: Insufficient documentation

## 2023-11-27 DIAGNOSIS — R220 Localized swelling, mass and lump, head: Secondary | ICD-10-CM | POA: Diagnosis present

## 2023-11-27 DIAGNOSIS — L509 Urticaria, unspecified: Secondary | ICD-10-CM | POA: Diagnosis not present

## 2023-11-27 DIAGNOSIS — F1090 Alcohol use, unspecified, uncomplicated: Secondary | ICD-10-CM | POA: Diagnosis not present

## 2023-11-27 DIAGNOSIS — T783XXA Angioneurotic edema, initial encounter: Secondary | ICD-10-CM | POA: Diagnosis present

## 2023-11-27 DIAGNOSIS — T783XXD Angioneurotic edema, subsequent encounter: Principal | ICD-10-CM

## 2023-11-27 MED ORDER — METHYLPREDNISOLONE 4 MG PO TBPK: ORAL_TABLET | Freq: Every day | ORAL | 0 refills | Status: DC

## 2023-11-27 MED ORDER — METHYLPREDNISOLONE SODIUM SUCC 125 MG IJ SOLR
125.0000 mg | Freq: Once | INTRAMUSCULAR | Status: AC
  Administered 2023-11-27: 125 mg via INTRAVENOUS
  Filled 2023-11-27: qty 2

## 2023-11-27 MED ORDER — FAMOTIDINE IN NACL 20-0.9 MG/50ML-% IV SOLN
20.0000 mg | Freq: Once | INTRAVENOUS | Status: AC
  Administered 2023-11-27: 20 mg via INTRAVENOUS
  Filled 2023-11-27: qty 50

## 2023-11-27 NOTE — ED Provider Notes (Signed)
 Blood pressure 129/86, pulse 81, temperature 98.1 F (36.7 C), resp. rate 14, height 5' 8 (1.727 m), last menstrual period 11/13/2023, SpO2 97%, not currently breastfeeding.  Assuming care from Dr. Zackowski.  In short, Jamie Vang is a 43 y.o. female with a chief complaint of Angioedema and Urticaria .  Refer to the original H&P for additional details.  The current plan of care is to observe in the ED for an additional hour.

## 2023-11-27 NOTE — ED Provider Notes (Signed)
 [place] [facility]MENT AT [facility] Provider Note   CSN: [id number] Arrival date & time: 11/27/23  2114     Patient presents with: Angioedema and Urticaria   Jamie Vang is a 43 y.o. female patient reports to emergency room with complaint of swelling to left lower lip and urticaria.  Rash is itchy and erythematous.  She does not report any chest pain, shortness of breath, tongue swelling or swelling of posterior pharynx.  She denies any abdominal cramping.  Patient reports that this started approximately 30 minutes prior to arrival and she took 50 mg of oral Benadryl  but no IM epi.  She does clarify that she has EpiPen  at home. She denies any suspicious food intake or known allergen exposure no new products or known chemical exposure.  She does report she has pretty extensive allergy history including several plants, beef cord peas. Has had this before.     Urticaria       Prior to Admission medications   Medication Sig Start Date End Date Taking? Authorizing Provider  methylPREDNISolone  (MEDROL  DOSEPAK) 4 MG TBPK tablet Take by mouth daily. 11/27/23  Yes Breniyah Romm, Warren SAILOR, PA-C  albuterol  (VENTOLIN  HFA) 108 (90 Base) MCG/ACT inhaler Inhale 1-2 puffs into the lungs every 6 (six) hours as needed for wheezing or shortness of breath.    [provider]  Cholecalciferol (VITAMIN D3) 50 MCG (2000 UT) TABS Take 2,000 Units by mouth in the morning.    [provider]  cyclobenzaprine  (FLEXERIL ) 10 MG tablet Take 1 tablet (10 mg total) by mouth 2 (two) times daily as needed for muscle spasms. 07/21/20   Dreama Longs, MD  famotidine  (PEPCID ) 20 MG tablet Take 1 tablet (20 mg total) by mouth 2 (two) times daily. 10/22/23   Palumbo, April, MD  HYDROcodone -acetaminophen  (NORCO/VICODIN) 5-325 MG tablet Take 1 tablet by mouth every 4 (four) hours as needed. 07/21/20   Dreama Longs, MD  ibuprofen  (ADVIL ) 600 MG tablet take 1 tablet po pc every 6  hours for 3 days then prn-post operative pain (1st dose 8:30 p.m.) 04/24/20   Perri Bjork, PA-C  metoCLOPramide  (REGLAN ) 10 MG tablet Take 1 tablet (10 mg total) by mouth every 8 (eight) hours as needed for nausea. Patient not taking: Reported on 04/16/2020 02/01/20   Beverley Doffing A, PA-C  ondansetron  (ZOFRAN  ODT) 4 MG disintegrating tablet Take 1 tablet (4 mg total) by mouth every 8 (eight) hours as needed for nausea or vomiting. Patient not taking: Reported on 04/16/2020 01/11/20   Theadore Ozell HERO, MD  predniSONE  (DELTASONE ) 50 MG tablet Take 1 tablet (50 mg total) by mouth daily with breakfast. 10/22/23   Palumbo, April, MD  predniSONE  (STERAPRED UNI-PAK 21 TAB) 10 MG (21) TBPK tablet Take by mouth daily. Take 6 tabs by mouth daily  for 2 days, then 5 tabs for 2 days, then 4 tabs for 2 days, then 3 tabs for 2 days, 2 tabs for 2 days, then 1 tab by mouth daily for 2 days 06/01/20   Joldersma, Logan, PA-C    Allergies: Other    Review of Systems  Skin:  Positive for rash.    Updated Vital Signs BP 123/71 (BP Location: Right Arm)   Pulse 83   Temp 98.1 F (36.7 C)   Resp 18   Ht 5' 8 (1.727 m)   LMP 11/13/2023   SpO2 100%   Breastfeeding No   BMI 28.12 kg/m   Physical Exam Vitals and nursing  note reviewed.  Constitutional:      General: She is not in acute distress.    Appearance: She is not toxic-appearing.  HENT:     Head: Normocephalic and atraumatic.     Comments: Left lower lip swelling.  Handling secretions with normal phonation.  No shortness of breath, no wheezing or adventitious breath sounds. Eyes:     General: No scleral icterus.    Conjunctiva/sclera: Conjunctivae normal.  Cardiovascular:     Rate and Rhythm: Normal rate and regular rhythm.     Pulses: Normal pulses.     Heart sounds: Normal heart sounds.  Pulmonary:     Effort: Pulmonary effort is normal. No respiratory distress.     Breath sounds: Normal breath sounds.  Abdominal:     General: Abdomen is  flat. Bowel sounds are normal.     Palpations: Abdomen is soft.     Tenderness: There is no abdominal tenderness.  Skin:    General: Skin is warm and dry.     Findings: No lesion.     Comments: Patient has large welt over bilateral wrists and left hip.  Neurological:     General: No focal deficit present.     Mental Status: She is alert and oriented to person, place, and time. Mental status is at baseline.     (all labs ordered are listed, but only abnormal results are displayed) Labs Reviewed - No data to display  EKG: None  Radiology: No results found.   Procedures   Medications Ordered in the ED  famotidine  (PEPCID ) IVPB 20 mg premix (20 mg Intravenous New Bag/Given 11/27/23 2142)  methylPREDNISolone  sodium succinate (SOLU-MEDROL ) 125 mg/2 mL injection 125 mg (125 mg Intravenous Given 11/27/23 2139)                                    Medical Decision Making Risk Prescription drug management.   This patient presents to the ED for concern of allergic reaction, this involves an extensive number of treatment options, and is a complaint that carries with it a high risk of complications and morbidity.  The differential diagnosis includes anaphylaxis, [place] area, medication side effect, allergy   Co morbidities that complicate the patient evaluation  Asthma, allergy    Additional history obtained:  Additional history obtained from I did review patient's ED visit from 10/22/2023 in which patient was seen for urticaria    Cardiac Monitoring: / EKG:  The patient was maintained on a cardiac monitor.     Problem List / ED Course / Critical interventions / Medication management  Patient presents to emergency room with complaint of left lower lip swelling and Urticaria, she denies any difficulty handling secretions and she has normal phonation, no reported shortness of breath or oral swelling.  Her lungs are clear to auscultation and she is well-appearing speaking in  full sentences.  She is hemodynamically stable and well-appearing.  I have reviewed pertinent history will treat as allergic reaction with steroids, Pepcid .  I do not feel she requires epi at this time symptoms do not seem consistent with anaphylaxis.  Will monitor her response to these medicines for ultimate disposition.  Patient does report that she does have EpiPen  at home and does not need refills. I ordered medication including solumedrol, Pepcid  Reevaluation of the patient after these medicines showed that the patient improved I have reviewed the patients home medicines and have made  adjustments as needed        Final diagnoses:  Allergic reaction, initial encounter    ED Discharge Orders          Ordered    methylPREDNISolone  (MEDROL  DOSEPAK) 4 MG TBPK tablet  Daily        11/27/23 2143               Clemie General N, PA-C 11/27/23 2207    Geraldene Hamilton, MD 11/27/23 7644    Geraldene Hamilton, MD 11/27/23 [phone]

## 2023-11-27 NOTE — ED Triage Notes (Addendum)
 Pt reports angioedema x 30 min.  Pt with large urticaria to BL forearms, L outer thigh that appeared earlier today.  Denies shob, new medications, lotions, detergents,

## 2023-11-27 NOTE — ED Notes (Signed)
Pt placed on the monitor with a 5 lead, BP cuff and pulse ox

## 2023-11-27 NOTE — Discharge Instructions (Signed)
 Please take steroids as prescribed follow-up closely with the allergen specialist.  Return to emergency room with new or worsening symptoms.

## 2023-11-27 NOTE — ED Notes (Signed)
 Pt given a ice pack per pt request for lips swelling

## 2023-11-28 DIAGNOSIS — R531 Weakness: Secondary | ICD-10-CM | POA: Diagnosis not present

## 2023-11-28 DIAGNOSIS — Z7401 Bed confinement status: Secondary | ICD-10-CM | POA: Diagnosis not present

## 2023-11-28 DIAGNOSIS — R220 Localized swelling, mass and lump, head: Secondary | ICD-10-CM

## 2023-11-28 DIAGNOSIS — T783XXD Angioneurotic edema, subsequent encounter: Secondary | ICD-10-CM | POA: Diagnosis present

## 2023-11-28 LAB — CBC WITH DIFFERENTIAL/PLATELET
Abs Immature Granulocytes: 0.02 K/uL (ref 0.00–0.07)
Basophils Absolute: 0 K/uL (ref 0.0–0.1)
Basophils Relative: 0 %
Eosinophils Absolute: 0.2 K/uL (ref 0.0–0.5)
Eosinophils Relative: 2 %
HCT: 37 % (ref 36.0–46.0)
Hemoglobin: 11.9 g/dL — ABNORMAL LOW (ref 12.0–15.0)
Immature Granulocytes: 0 %
Lymphocytes Relative: 36 %
Lymphs Abs: 2.8 K/uL (ref 0.7–4.0)
MCH: 29.5 pg (ref 26.0–34.0)
MCHC: 32.2 g/dL (ref 30.0–36.0)
MCV: 91.6 fL (ref 80.0–100.0)
Monocytes Absolute: 0.6 K/uL (ref 0.1–1.0)
Monocytes Relative: 8 %
Neutro Abs: 4.1 K/uL (ref 1.7–7.7)
Neutrophils Relative %: 54 %
Platelets: 392 K/uL (ref 150–400)
RBC: 4.04 MIL/uL (ref 3.87–5.11)
RDW: 13.2 % (ref 11.5–15.5)
WBC: 7.8 K/uL (ref 4.0–10.5)
nRBC: 0 % (ref 0.0–0.2)

## 2023-11-28 LAB — CBC
HCT: 38.8 % (ref 36.0–46.0)
Hemoglobin: 12.5 g/dL (ref 12.0–15.0)
MCH: 29.2 pg (ref 26.0–34.0)
MCHC: 32.2 g/dL (ref 30.0–36.0)
MCV: 90.7 fL (ref 80.0–100.0)
Platelets: 419 K/uL — ABNORMAL HIGH (ref 150–400)
RBC: 4.28 MIL/uL (ref 3.87–5.11)
RDW: 13 % (ref 11.5–15.5)
WBC: 8.4 K/uL (ref 4.0–10.5)
nRBC: 0 % (ref 0.0–0.2)

## 2023-11-28 LAB — TYPE AND SCREEN
ABO/RH(D): A POS
Antibody Screen: NEGATIVE

## 2023-11-28 LAB — BASIC METABOLIC PANEL WITH GFR
Anion gap: 10 (ref 5–15)
BUN: 8 mg/dL (ref 6–20)
CO2: 26 mmol/L (ref 22–32)
Calcium: 8.6 mg/dL — ABNORMAL LOW (ref 8.9–10.3)
Chloride: 102 mmol/L (ref 98–111)
Creatinine, Ser: 1.06 mg/dL — ABNORMAL HIGH (ref 0.44–1.00)
GFR, Estimated: >60 mL/min (ref >60)
Glucose, Bld: 107 mg/dL — ABNORMAL HIGH (ref 70–99)
Potassium: 3.3 mmol/L — ABNORMAL LOW (ref 3.5–5.1)
Sodium: 138 mmol/L (ref 135–145)

## 2023-11-28 LAB — CREATININE, SERUM
Creatinine, Ser: 1.08 mg/dL — ABNORMAL HIGH (ref 0.44–1.00)
GFR, Estimated: >60 mL/min (ref >60)

## 2023-11-28 LAB — BPAM FFP
Blood Product Expiration Date: 202511262359
Unit Type and Rh: 6200

## 2023-11-28 LAB — HIV ANTIBODY (ROUTINE TESTING W REFLEX): HIV Screen 4th Generation wRfx: NONREACTIVE

## 2023-11-28 LAB — ABO/RH: ABO/RH(D): A POS

## 2023-11-28 MED ORDER — FAMOTIDINE 20 MG PO TABS: 20.0000 mg | ORAL_TABLET | Freq: Two times a day (BID) | ORAL | 0 refills | Status: DC

## 2023-11-28 MED ORDER — INFLUENZA VIRUS VACC SPLIT PF (FLUZONE) 0.5 ML IM SUSY: 0.5000 mL | PREFILLED_SYRINGE | INTRAMUSCULAR | Status: DC

## 2023-11-28 MED ORDER — TRANEXAMIC ACID 1000 MG/10ML IV SOLN
1000.0000 mg | Freq: Once | INTRAVENOUS | Status: AC
  Administered 2023-11-28: 1000 mg via INTRAVENOUS
  Filled 2023-11-28: qty 10

## 2023-11-28 MED ORDER — PREDNISONE 10 MG PO TABS: 40.0000 mg | ORAL_TABLET | Freq: Every day | ORAL | 0 refills | Status: DC

## 2023-11-28 MED ORDER — FLUTICASONE FUROATE-VILANTEROL 100-25 MCG/ACT IN AEPB
1.0000 | INHALATION_SPRAY | Freq: Every day | RESPIRATORY_TRACT | Status: DC
  Filled 2023-11-28: qty 28

## 2023-11-28 MED ORDER — AZELASTINE HCL 0.1 % NA SOLN: 1.0000 | Freq: Every day | NASAL | Status: DC | PRN

## 2023-11-28 MED ORDER — ENOXAPARIN SODIUM 40 MG/0.4ML IJ SOSY
40.0000 mg | PREFILLED_SYRINGE | INTRAMUSCULAR | Status: DC
  Administered 2023-11-28: 40 mg via SUBCUTANEOUS
  Filled 2023-11-28: qty 0.4

## 2023-11-28 MED ORDER — PREDNISONE 10 MG PO TABS: 20.0000 mg | ORAL_TABLET | Freq: Every day | ORAL | 0 refills | Status: AC

## 2023-11-28 MED ORDER — FAMOTIDINE 20 MG PO TABS: 20.0000 mg | ORAL_TABLET | Freq: Every day | ORAL | 0 refills | Status: AC

## 2023-11-28 MED ORDER — DIPHENHYDRAMINE HCL 25 MG PO CAPS
25.0000 mg | ORAL_CAPSULE | Freq: Four times a day (QID) | ORAL | Status: DC | PRN
Start: 2023-11-28 — End: 2023-11-28
  Administered 2023-11-28: 25 mg via ORAL
  Filled 2023-11-28: qty 1

## 2023-11-28 MED ORDER — EPINEPHRINE 0.3 MG/0.3ML IJ SOAJ
0.3000 mg | Freq: Once | INTRAMUSCULAR | Status: AC
  Administered 2023-11-28: 0.3 mg via INTRAMUSCULAR
  Filled 2023-11-28: qty 0.3

## 2023-11-28 MED ORDER — IPRATROPIUM-ALBUTEROL 0.5-2.5 (3) MG/3ML IN SOLN: 3.0000 mL | Freq: Four times a day (QID) | RESPIRATORY_TRACT | Status: DC | PRN

## 2023-11-28 NOTE — Progress Notes (Signed)
 [facility]  EXPEDITER LEVEL LOADING ASSESSMENT NOTE  Patient Name: Jamie Vang  DOB:April 29, 1980 Date of Admission: 11/27/2023  Date of Assessment:11/28/23   -------------------------------------------------------------------------------------------------------------------   Brief clinical summary: 43 yo female with angioedema, rash.  Is there Bed Availability at another [facility]? Yes  If yes, what facility: [facility]  Level of Care Needed:  Yes  MD Agree to transfer: Yes  Patient agrees to transfer: Yes [facility] notified.    -------------------------------------------------------------------------------------------------------------------  [facility] RN Expediter, Sharolyn JONETTA Batman Please contact us  directly via secure chat (search for [facility]) or by calling us  at [phone] [facility]).

## 2023-11-28 NOTE — ED Notes (Signed)
 Breakfast tray ordered

## 2023-11-28 NOTE — H&P (Signed)
 History and Physical    Patient: Jamie Vang FMW:[id number] DOB: 1980/08/06 DOA: 11/27/2023 DOS: the patient was seen and examined on 11/28/2023 PCP: [facility]  Patient coming from: Home  Chief Complaint:  Chief Complaint  Patient presents with   Angioedema   Urticaria   HPI: Jamie Vang is a 43 y.o. female with medical history significant of asthma, PCOS, anxiety/depression who p/w lip swelling and b/l UE w/ urticaria.    In the ED, pt AFVSS. Labs notable for K 3.3. EDP transferred pt ED to ED given c/f for C1 esterase deficiency; of note, no recent ACEi exposure. Received IV TXA, solumedrol, famotidine , and epinephrine  at OSH. Pt admitted to medicine for ongoing w/u.      Review of Systems: As mentioned in the history of present illness. All other systems reviewed and are negative. Past Medical History:  Diagnosis Date   Anxiety    Asthma    COVID-19    Depression    PCOS (polycystic ovarian syndrome)    Past Surgical History:  Procedure Laterality Date   CESAREAN SECTION     CESAREAN SECTION     KNEE SURGERY     LAPAROSCOPIC BILATERAL SALPINGECTOMY Bilateral 04/24/2020   Procedure: LAPAROSCOPIC BILATERAL SALPINGECTOMY;  Surgeon: Henry Slough, MD;  Location: [facility] OR;  Service: Gynecology;  Laterality: Bilateral;   Social History:  reports that she has quit smoking. Her smoking use included cigars. She has never used smokeless tobacco. She reports current alcohol use. She reports that she does not use drugs.  Allergies  Allergen Reactions   Alprazolam Other (See Comments)    Dizziness   Other Swelling    Beef / corn / peas/ milk/ whole wheat.  Facial swelling     Family History  Problem Relation Age of Onset   Diabetes Mother    Heart failure Mother    Hypertension Mother     Prior to Admission medications   Medication Sig Start Date End Date Taking? Authorizing Provider  albuterol  (VENTOLIN  HFA) 108 (90 Base) MCG/ACT inhaler Inhale  1-2 puffs into the lungs every 6 (six) hours as needed for wheezing or shortness of breath.   Yes [provider]  azelastine  (ASTELIN ) 0.1 % nasal spray Place 1-2 sprays into both nostrils daily as needed for allergies.   Yes [provider]  famotidine  (PEPCID ) 20 MG tablet Take 1 tablet (20 mg total) by mouth 2 (two) times daily. 11/28/23  Yes Zackowski, Scott, MD  fluticasone -salmeterol (ADVAIR) 100-50 MCG/ACT AEPB Inhale 1 puff into the lungs at bedtime as needed (wheezing/sob).   Yes [provider]  methylPREDNISolone  (MEDROL  DOSEPAK) 4 MG TBPK tablet Take by mouth daily. 11/27/23  Yes Barrett, Warren SAILOR, PA-C  predniSONE  (DELTASONE ) 10 MG tablet Take 4 tablets (40 mg total) by mouth daily. 11/28/23  Yes Zackowski, Scott, MD  montelukast (SINGULAIR) 10 MG tablet Take 10 mg by mouth at bedtime. Patient not taking: Reported on 11/28/2023    [provider]    Physical Exam: Vitals:   11/28/23 0500 11/28/23 0715 11/28/23 0848 11/28/23 1116  BP: (!) 102/56 106/70  136/77  Pulse: 87 97  85  Resp: 15 16  16   Temp: 98 F (36.7 C)  98.1 F (36.7 C) 97.7 F (36.5 C)  TempSrc: Oral  Oral Oral  SpO2: 98% 94%  98%  Height:       General: Alert, oriented x3, resting comfortably in no acute distress Respiratory: Lungs clear to auscultation bilaterally with  normal respiratory effort; no w/r/r Cardiovascular: Regular rate and rhythm w/o m/r/g Abdomen: Soft, nontender, nondistended. Positive bowel sounds   Data Reviewed:  Lab Results  Component Value Date   WBC 8.4 11/28/2023   HGB 12.5 11/28/2023   HCT 38.8 11/28/2023   MCV 90.7 11/28/2023   PLT 419 (H) 11/28/2023   Lab Results  Component Value Date   GLUCOSE 107 (H) 11/28/2023   CALCIUM 8.6 (L) 11/28/2023   NA 138 11/28/2023   K 3.3 (L) 11/28/2023   CO2 26 11/28/2023   CL 102 11/28/2023   BUN 8 11/28/2023   CREATININE 1.08 (H) 11/28/2023   Lab Results  Component Value Date   ALT 25  02/08/2020   AST 23 02/08/2020   ALKPHOS 38 02/08/2020   BILITOT 0.3 02/08/2020   No results found for: INR, PROTIME Radiology: No results found.  Assessment and Plan: 28F h/o asthma, PCOS, anxiety/depression who p/w lip swelling and b/l UE w/ urticaria.  Lip swelling B/l UE urticaria No recent exposure to ACEi/ARB/estrogen; low suspicion for C1 esterase deficiency given good response to ED tx; suspect allergic reaction -F/u C4 esterase; if wnl, no further w/u required -Benadryl  prn -CTM to ensure no airway compromise -OP allergy/imunology w/u at d/c  Asthma -Breo Ellipta  daily and Duonebs prn    Advance Care Planning:   Code Status: Full Code   Consults: N/A  Family Communication: N/A  Severity of Illness: The appropriate patient status for this patient is INPATIENT. Inpatient status is judged to be reasonable and necessary in order to provide the required intensity of service to ensure the patient's safety. The patient's presenting symptoms, physical exam findings, and initial radiographic and laboratory data in the context of their chronic comorbidities is felt to place them at high risk for further clinical deterioration. Furthermore, it is not anticipated that the patient will be medically stable for discharge from the hospital within 2 midnights of admission.   * I certify that at the point of admission it is my clinical judgment that the patient will require inpatient hospital care spanning beyond 2 midnights from the point of admission due to high intensity of service, high risk for further deterioration and high frequency of surveillance required.*   ------- I spent 55 minutes reviewing previous notes, at the bedside counseling/discussing the treatment plan, and performing clinical documentation.  Author: Marsha Ada, MD 11/28/2023 12:18 PM  For on call review [email or web address].

## 2023-11-28 NOTE — ED Provider Notes (Signed)
 Patient transferred from [facility], has angioedema to the lip and lower face.  No evidence of involvement of the mouth, posterior oropharynx.  She does feel slightly improved from earlier.  Will hold off on FFP at this time but plan to admit for observation.  Medicine consulted for admission.   Griselda Norris, MD 11/28/23 [phone]

## 2023-11-28 NOTE — ED Provider Notes (Signed)
  Physical Exam  BP (!) 102/56 (BP Location: Left Arm)   Pulse 87   Temp 98 F (36.7 C) (Oral)   Resp 15   Ht 5' 8 (1.727 m)   LMP 11/13/2023   SpO2 98%   Breastfeeding No   BMI 28.12 kg/m   Physical Exam  Procedures  Procedures  ED Course / MDM    Medical Decision Making Amount and/or Complexity of Data Reviewed Labs: ordered.  Risk Prescription drug management. Decision regarding hospitalization.   64F presenting with worsening angioedema, admission pending. Received IM Epi and TXA.  Patient admitted to the hospitalist service in stable condition.         Jerrol Agent, MD 11/28/23 0930

## 2023-11-28 NOTE — ED Notes (Signed)
 Called [facility] for pt pick up

## 2023-11-28 NOTE — ED Notes (Signed)
 Pink tube sent to lab as last one did not contain pt MR#

## 2023-11-28 NOTE — ED Notes (Signed)
Pt provided with food and juice.

## 2023-11-28 NOTE — Discharge Summary (Signed)
 Physician Discharge Summary   Patient: Jamie Vang MRN: [id number] DOB: Jan 20, 1980  Admit date:     11/27/2023  Discharge date: 11/28/23  Discharge Physician: Marsha Ada   PCP: [facility]   Recommendations at discharge:   Complete prednisone  20mg  daily for 3 days. Follow-up with outpatient allergy/immunology for further testing.  Discharge Diagnoses: Principal Problem:   Lip swelling  Resolved Problems:   * No resolved hospital problems. *  Hospital Course: Jamie Vang is a 43 y.o. female with medical history significant of asthma, PCOS, anxiety/depression who p/w lip swelling and b/l UE w/ urticaria.   The patient reported experiencing itching and burning in the facial area after opening a can of wine cooler. The symptoms began shortly after taking a sip and escalated quickly, prompting the patient to take two Benadryl  tablets. This is the third occurrence of such a reaction, with previous incidents involving swelling of the lips, treated with steroids and an EpiPen . The current episode was more severe than the previous ones. 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 The patient reported partial improvement after twenty-four hours. The patient did not experience airway closure or difficulty breathing. A C4 lab test was pending to rule out complement deficiency.   In the ED, pt AFVSS. Labs notable for K 3.3. EDP transferred pt ED to ED given c/f for C1 esterase deficiency; of note, no recent ACEi exposure. Received IV TXA, solumedrol, famotidine , and epinephrine  at OSH. Pt admitted to medicine for ongoing w/u.  Assessment and Plan: 84F h/o asthma, PCOS, anxiety/depression who p/w lip swelling and b/l UE w/ urticaria.   Lip swelling B/l UE urticaria No recent exposure to ACEi/ARB/estrogen; low suspicion for C1 esterase deficiency given  good response to ED tx; suspect allergic reaction -F/u C4 esterase; if wnl, no further w/u required -Prednisone  20mg  daily for 3 days -Benadryl  prn -CTM to ensure no airway compromise; pt agreeable to discharge after prolonged observation -OP allergy/imunology w/u at d/c   Asthma -Breo Ellipta  daily and Duonebs prn       Consultants: N/A Procedures performed: N/A  Disposition: Home Diet recommendation:  Discharge Diet Orders (From admission, onward)     Start     Ordered   11/28/23 0000  Diet - low sodium heart healthy        11/28/23 1521           Regular diet DISCHARGE MEDICATION: Allergies as of 11/28/2023       Reactions   Alprazolam Other (See Comments)   Dizziness   Other Swelling   Beef / corn / peas/ milk/ whole wheat.  Facial swelling         Medication List     STOP taking these medications    methylPREDNISolone  4 MG Tbpk tablet Commonly known as: MEDROL  DOSEPAK   montelukast 10 MG tablet Commonly known as: SINGULAIR       TAKE these medications    albuterol  108 (90 Base) MCG/ACT inhaler Commonly known as: VENTOLIN  HFA Inhale 1-2 puffs into the lungs every 6 (six) hours as needed for wheezing or shortness of breath.   azelastine  0.1 % nasal spray Commonly known as: ASTELIN  Place 1-2 sprays into both nostrils daily as needed for allergies.   famotidine  20 MG tablet Commonly known as: PEPCID  Take 1 tablet (20 mg total) by mouth daily.   fluticasone -salmeterol 100-50 MCG/ACT Aepb Commonly  known as: ADVAIR Inhale 1 puff into the lungs at bedtime as needed (wheezing/sob).   predniSONE  10 MG tablet Commonly known as: DELTASONE  Take 2 tablets (20 mg total) by mouth daily for 3 days.        Follow-up Information     [facility]   Contact information: [address] [facility] [place] [address] [phone]                Discharge Exam: There were no vitals filed for this visit. General:  Alert, oriented x3, resting comfortably in no acute distress HEENT: EOMI, oropharynx clear, moist mucous membranes, hearing intact; no obvious lip swelling on exam Neck: Trachea midline and no gross thyromegaly Respiratory: Lungs clear to auscultation bilaterally with normal respiratory effort; no w/r/r Cardiovascular: Regular rate and rhythm w/o m/r/g Abdomen: Soft, nontender, nondistended. Positive bowel sounds   Condition at discharge: good  The results of significant diagnostics from this hospitalization (including imaging, microbiology, ancillary and laboratory) are listed below for reference.   Imaging Studies: No results found.  Microbiology: Results for orders placed or performed during the hospital encounter of 04/22/20  SARS CORONAVIRUS 2 (TAT 6-24 HRS) Nasopharyngeal Nasopharyngeal Swab     Status: None   Collection Time: 04/22/20 12:05 PM   Specimen: Nasopharyngeal Swab  Result Value Ref Range Status   SARS Coronavirus 2 NEGATIVE NEGATIVE Final    Comment: (NOTE) SARS-CoV-2 target nucleic acids are NOT DETECTED.  The SARS-CoV-2 RNA is generally detectable in upper and lower respiratory specimens during the acute phase of infection. Negative results do not preclude SARS-CoV-2 infection, do not rule out co-infections with other pathogens, and should not be used as the sole basis for treatment or other patient management decisions. Negative results must be combined with clinical observations, patient history, and epidemiological information. The expected result is Negative.  Fact Sheet for Patients: hairslick.no  Fact Sheet for Healthcare Providers: quierodirigir.com  This test is not yet approved or cleared by the [place]  FDA and  has been authorized for detection and/or diagnosis of SARS-CoV-2 by FDA under an Emergency Use Authorization (EUA). This EUA will remain  in effect (meaning this test can be  used) for the duration of the COVID-19 declaration under Se ction 564(b)(1) of the Act, 21 U.S.C. section 360bbb-3(b)(1), unless the authorization is terminated or revoked sooner.  Performed at [facility], [address] [place], [place] [address]     Labs: CBC: Recent Labs  Lab 11/28/23 0200 11/28/23 0850  WBC 7.8 8.4  NEUTROABS 4.1  --   HGB 11.9* 12.5  HCT 37.0 38.8  MCV 91.6 90.7  PLT 392 419*   Basic Metabolic Panel: Recent Labs  Lab 11/28/23 0200 11/28/23 0850  NA 138  --   K 3.3*  --   CL 102  --   CO2 26  --   GLUCOSE 107*  --   BUN 8  --   CREATININE 1.06* 1.08*  CALCIUM 8.6*  --    Liver Function Tests: No results for input(s): AST, ALT, ALKPHOS, BILITOT, PROT, ALBUMIN in the last 168 hours. CBG: No results for input(s): GLUCAP in the last 168 hours.  Discharge time spent: greater than 30 minutes.   ------- I spent 65 minutes reviewing previous notes, at the bedside counseling/discussing the treatment plan, and performing clinical documentation.  Signed: Marsha Ada, MD [facility] 11/28/2023

## 2023-11-29 LAB — PREPARE FRESH FROZEN PLASMA

## 2023-11-29 LAB — BPAM FFP
ISSUE DATE / TIME: 202511220037
ISSUE DATE / TIME: 202511260026
ISSUE DATE / TIME: 202511262359
PRODUCT CODE: 202511262359
Unit Type and Rh: 202511262359
Unit Type and Rh: 6200
Unit Type and Rh: 6200

## 2023-11-30 LAB — C4 COMPLEMENT: Complement C4, Body Fluid: 22 mg/dL (ref 12–38)
